# Patient Record
Sex: Female | Born: 1965 | Race: White | Hispanic: No | Marital: Married | Smoking: Former smoker
Health system: Southern US, Community
[De-identification: ages and names within clinical notes are randomized; demographics above are authoritative.]

## PROBLEM LIST (undated history)

## (undated) DIAGNOSIS — E039 Hypothyroidism, unspecified: Secondary | ICD-10-CM

## (undated) DIAGNOSIS — F329 Major depressive disorder, single episode, unspecified: Secondary | ICD-10-CM

## (undated) DIAGNOSIS — E559 Vitamin D deficiency, unspecified: Secondary | ICD-10-CM

## (undated) DIAGNOSIS — E041 Nontoxic single thyroid nodule: Secondary | ICD-10-CM

## (undated) DIAGNOSIS — F32A Depression, unspecified: Secondary | ICD-10-CM

## (undated) DIAGNOSIS — N200 Calculus of kidney: Secondary | ICD-10-CM

## (undated) HISTORY — DX: Calculus of kidney: N20.0

## (undated) HISTORY — DX: Depression, unspecified: F32.A

## (undated) HISTORY — DX: Hypothyroidism, unspecified: E03.9

## (undated) HISTORY — DX: Vitamin D deficiency, unspecified: E55.9

## (undated) HISTORY — PX: INGUINAL HERNIA REPAIR: SUR1180

## (undated) HISTORY — DX: Major depressive disorder, single episode, unspecified: F32.9

## (undated) HISTORY — DX: Nontoxic single thyroid nodule: E04.1

## (undated) HISTORY — PX: BREAST EXCISIONAL BIOPSY: SUR124

---
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
## 1999-09-04 ENCOUNTER — Other Ambulatory Visit: Admission: RE | Admit: 1999-09-04 | Discharge: 1999-09-04 | Payer: Self-pay | Admitting: Obstetrics and Gynecology

## 2000-09-19 ENCOUNTER — Other Ambulatory Visit: Admission: RE | Admit: 2000-09-19 | Discharge: 2000-09-19 | Payer: Self-pay | Admitting: Obstetrics and Gynecology

## 2001-09-19 ENCOUNTER — Other Ambulatory Visit: Admission: RE | Admit: 2001-09-19 | Discharge: 2001-09-19 | Payer: Self-pay | Admitting: Obstetrics and Gynecology

## 2002-08-05 ENCOUNTER — Encounter: Admission: RE | Admit: 2002-08-05 | Discharge: 2002-08-05 | Payer: Self-pay | Admitting: Obstetrics & Gynecology

## 2002-08-05 ENCOUNTER — Encounter: Payer: Self-pay | Admitting: Obstetrics & Gynecology

## 2002-11-03 ENCOUNTER — Other Ambulatory Visit: Admission: RE | Admit: 2002-11-03 | Discharge: 2002-11-03 | Payer: Self-pay | Admitting: Obstetrics and Gynecology

## 2003-08-13 ENCOUNTER — Encounter: Payer: Self-pay | Admitting: Obstetrics & Gynecology

## 2003-08-13 ENCOUNTER — Encounter: Admission: RE | Admit: 2003-08-13 | Discharge: 2003-08-13 | Payer: Self-pay | Admitting: Obstetrics & Gynecology

## 2003-11-09 ENCOUNTER — Other Ambulatory Visit: Admission: RE | Admit: 2003-11-09 | Discharge: 2003-11-09 | Payer: Self-pay | Admitting: Obstetrics & Gynecology

## 2004-02-09 ENCOUNTER — Encounter: Admission: RE | Admit: 2004-02-09 | Discharge: 2004-02-09 | Payer: Self-pay | Admitting: Obstetrics & Gynecology

## 2004-05-24 ENCOUNTER — Ambulatory Visit (HOSPITAL_COMMUNITY): Admission: RE | Admit: 2004-05-24 | Discharge: 2004-05-24 | Payer: Self-pay | Admitting: Chiropractic Medicine

## 2004-10-04 ENCOUNTER — Ambulatory Visit: Payer: Self-pay | Admitting: Internal Medicine

## 2004-12-14 ENCOUNTER — Ambulatory Visit (HOSPITAL_COMMUNITY): Admission: RE | Admit: 2004-12-14 | Discharge: 2004-12-14 | Payer: Self-pay | Admitting: Surgery

## 2004-12-14 ENCOUNTER — Ambulatory Visit (HOSPITAL_BASED_OUTPATIENT_CLINIC_OR_DEPARTMENT_OTHER): Admission: RE | Admit: 2004-12-14 | Discharge: 2004-12-14 | Payer: Self-pay | Admitting: Surgery

## 2004-12-14 ENCOUNTER — Encounter (INDEPENDENT_AMBULATORY_CARE_PROVIDER_SITE_OTHER): Payer: Self-pay | Admitting: Specialist

## 2005-01-19 ENCOUNTER — Ambulatory Visit: Payer: Self-pay | Admitting: Endocrinology

## 2005-01-19 ENCOUNTER — Encounter: Admission: RE | Admit: 2005-01-19 | Discharge: 2005-01-19 | Payer: Self-pay | Admitting: Endocrinology

## 2005-04-18 ENCOUNTER — Ambulatory Visit: Payer: Self-pay | Admitting: Internal Medicine

## 2005-05-23 ENCOUNTER — Ambulatory Visit (HOSPITAL_COMMUNITY): Admission: RE | Admit: 2005-05-23 | Discharge: 2005-05-23 | Payer: Self-pay | Admitting: *Deleted

## 2006-01-16 ENCOUNTER — Ambulatory Visit: Payer: Self-pay | Admitting: Internal Medicine

## 2006-03-13 ENCOUNTER — Encounter: Admission: RE | Admit: 2006-03-13 | Discharge: 2006-03-13 | Payer: Self-pay | Admitting: Obstetrics & Gynecology

## 2006-04-16 ENCOUNTER — Ambulatory Visit: Payer: Self-pay | Admitting: Internal Medicine

## 2006-04-17 ENCOUNTER — Ambulatory Visit: Payer: Self-pay | Admitting: Internal Medicine

## 2006-05-01 ENCOUNTER — Ambulatory Visit: Payer: Self-pay

## 2006-08-28 ENCOUNTER — Ambulatory Visit: Payer: Self-pay | Admitting: Internal Medicine

## 2007-07-15 ENCOUNTER — Ambulatory Visit: Payer: Self-pay | Admitting: Internal Medicine

## 2007-12-22 ENCOUNTER — Encounter: Admission: RE | Admit: 2007-12-22 | Discharge: 2007-12-22 | Payer: Self-pay | Admitting: Obstetrics and Gynecology

## 2009-01-28 ENCOUNTER — Encounter: Admission: RE | Admit: 2009-01-28 | Discharge: 2009-01-28 | Payer: Self-pay | Admitting: Obstetrics & Gynecology

## 2009-09-19 ENCOUNTER — Encounter: Payer: Self-pay | Admitting: Internal Medicine

## 2009-11-12 HISTORY — PX: INTRAUTERINE DEVICE INSERTION: SHX323

## 2010-02-08 ENCOUNTER — Encounter: Admission: RE | Admit: 2010-02-08 | Discharge: 2010-02-08 | Payer: Self-pay | Admitting: Obstetrics and Gynecology

## 2010-08-01 ENCOUNTER — Encounter: Payer: Self-pay | Admitting: Internal Medicine

## 2010-08-22 ENCOUNTER — Ambulatory Visit: Payer: Self-pay | Admitting: Internal Medicine

## 2010-08-22 DIAGNOSIS — E041 Nontoxic single thyroid nodule: Secondary | ICD-10-CM

## 2010-08-22 DIAGNOSIS — R5381 Other malaise: Secondary | ICD-10-CM

## 2010-08-22 DIAGNOSIS — R634 Abnormal weight loss: Secondary | ICD-10-CM

## 2010-08-22 DIAGNOSIS — R5383 Other fatigue: Secondary | ICD-10-CM

## 2010-08-22 DIAGNOSIS — F329 Major depressive disorder, single episode, unspecified: Secondary | ICD-10-CM

## 2010-08-22 DIAGNOSIS — E039 Hypothyroidism, unspecified: Secondary | ICD-10-CM | POA: Insufficient documentation

## 2010-08-23 ENCOUNTER — Encounter: Admission: RE | Admit: 2010-08-23 | Discharge: 2010-08-23 | Payer: Self-pay | Admitting: Internal Medicine

## 2010-08-25 ENCOUNTER — Telehealth: Payer: Self-pay | Admitting: Internal Medicine

## 2010-09-05 ENCOUNTER — Ambulatory Visit: Payer: Self-pay | Admitting: Internal Medicine

## 2010-09-06 ENCOUNTER — Ambulatory Visit: Payer: Self-pay | Admitting: Internal Medicine

## 2010-09-07 LAB — CONVERTED CEMR LAB: Free T4: 1 ng/dL (ref 0.60–1.60)

## 2010-10-19 ENCOUNTER — Encounter: Payer: Self-pay | Admitting: Internal Medicine

## 2010-12-02 ENCOUNTER — Other Ambulatory Visit: Payer: Self-pay | Admitting: Endocrinology

## 2010-12-02 DIAGNOSIS — E049 Nontoxic goiter, unspecified: Secondary | ICD-10-CM

## 2010-12-12 NOTE — Progress Notes (Signed)
  Phone Note Call from Patient   Summary of Call: Patient is requesting to be called back at [phone] and have her paged.  Initial call taken by: Lamar Sprinkles, CMA,  August 25, 2010 10:22 AM  Follow-up for Phone Call        Pt informed  Follow-up by: Lamar Sprinkles, CMA,  August 25, 2010 10:27 AM

## 2010-12-12 NOTE — Letter (Signed)
Summary: Out of Work  [facility]  [address] [place], [place] [address]   Phone: [phone]  Fax: [phone]    September 06, 2010   Employee:  Levin Erp    To Whom It May Concern:   For Medical reasons, please excuse the above named employee from work   Patient was in our office for appointment today 09/06/2010  If you need additional information, please feel free to contact our office.         Sincerely,    Alvy Beal A CMA for Dr. Posey Rea

## 2010-12-12 NOTE — Assessment & Plan Note (Signed)
Summary: 2-3 WK FU  STC   Vital Signs:  Patient profile:   45 year old female Height:      65 inches Weight:      145 pounds BMI:     24.22 Pulse rate:   96 / minute Pulse rhythm:   regular Resp:     16 per minute BP sitting:   100 / 70  (left arm) Cuff size:   regular  Vitals Entered By: Lanier Prude, CMA(AAMA) (September 06, 2010 8:07 AM) CC: 2 wk f/u of Thyroid tests Is Patient Diabetic? No Comments pt has not started Vit D yet   CC:  2 wk f/u of Thyroid tests.  History of Present Illness: F/u hypothyroidism, thyroid nodule, wt loss  Current Medications (verified): 1)  Zoloft 50 Mg Tabs (Sertraline Hcl) .Marland Kitchen.. 1 Once Daily 2)  Buspirone Hcl 5 Mg Tabs (Buspirone Hcl) .Marland Kitchen.. 1 Two Times A Day 3)  Levothroid 88 Mcg Tabs (Levothyroxine Sodium) .Marland Kitchen.. 1 Once Daily 4)  Mirena 20 Mcg/24hr Iud (Levonorgestrel) .... As Directed 5)  Alprazolam 0.25 Mg Tabs (Alprazolam) .Marland Kitchen.. 1 By Mouth Two Times A Day As Needed Anxiety 6)  Vitamin D 1000 Unit Tabs (Cholecalciferol) .Marland Kitchen.. 1 By Mouth Qd  Allergies (verified): No Known Drug Allergies  Past History:  Past Medical History: Depression Hypothyroidism Thyroid nodule GYN Debbora Dus NP Low nl Vit B12 2011  Family History: Reviewed history from 08/22/2010 and no changes required. M died of liver or a billiary Ca F HTN S,B well  Social History: Reviewed history from 08/22/2010 and no changes required. Occupation: [employer] [place] Current Smoker 1-2 cig a day Alcohol use-yes 8 wine coolers/wk Regular exercise-yes Married w/2 kids d 9 and s 16  Review of Systems  The patient denies weight loss.    Physical Exam  General:  Well-developed,well-nourished,in no acute distress; alert,appropriate and cooperative throughout examination Nose:  External nasal examination shows no deformity or inflammation. Nasal mucosa are pink and moist without lesions or exudates. Mouth:  Oral mucosa and oropharynx without lesions or exudates.   Teeth in good repair. Neck:  R thyroid nodule Lungs:  Normal respiratory effort, chest expands symmetrically. Lungs are clear to auscultation, no crackles or wheezes. Heart:  Normal rate and regular rhythm. S1 and S2 normal without gallop, murmur, click, rub or other extra sounds. Abdomen:  Bowel sounds positive,abdomen soft and non-tender without masses, organomegaly or hernias noted. Msk:  No deformity or scoliosis noted of thoracic or lumbar spine.   Neurologic:  No cranial nerve deficits noted. Station and gait are normal. Plantar reflexes are down-going bilaterally. DTRs are symmetrical throughout. Sensory, motor and coordinative functions appear intact. Skin:  Intact without suspicious lesions or rashes Psych:  Cognition and judgment appear intact. Alert and cooperative with normal attention span and concentration. No apparent delusions, illusions, hallucinations   Impression & Recommendations:  Problem # 1:  HYPOTHYROIDISM (ICD-244.9) Assessment Improved  Her updated medication list for this problem includes:    Levothroid 88 Mcg Tabs (Levothyroxine sodium) .Marland Kitchen... 1 once daily  Orders: Endocrinology Referral (Endocrine)  Problem # 2:  DEPRESSION (ICD-311) Assessment: Unchanged  Her updated medication list for this problem includes:    Zoloft 50 Mg Tabs (Sertraline hcl) .Marland Kitchen... 1 once daily    Buspirone Hcl 5 Mg Tabs (Buspirone hcl) .Marland Kitchen... 1 two times a day    Alprazolam 0.25 Mg Tabs (Alprazolam) .Marland Kitchen... 1 by mouth two times a day as needed anxiety  Problem # 3:  WEIGHT LOSS (ICD-783.21) resolved Assessment: Improved  Problem # 4:  THYROID NODULE (ICD-241.0) Assessment: New  [place] reviewed  Orders: Endocrinology Referral (Endocrine) Dr Talmage Nap  Complete Medication List: 1)  Zoloft 50 Mg Tabs (Sertraline hcl) .Marland Kitchen.. 1 once daily 2)  Buspirone Hcl 5 Mg Tabs (Buspirone hcl) .Marland Kitchen.. 1 two times a day 3)  Levothroid 88 Mcg Tabs (Levothyroxine sodium) .Marland Kitchen.. 1 once daily 4)  Mirena 20  Mcg/24hr Iud (Levonorgestrel) .... As directed 5)  Alprazolam 0.25 Mg Tabs (Alprazolam) .Marland Kitchen.. 1 by mouth two times a day as needed anxiety 6)  Vitamin D 1000 Unit Tabs (Cholecalciferol) .Marland Kitchen.. 1 by mouth qd 7)  Vitamin B-12 500 Mcg Tabs (Cyanocobalamin) .Marland Kitchen.. 1 by mouth once daily for vitamin b12 deficiency  Patient Instructions: 1)  Please schedule a follow-up appointment in 6 months TSH B12 780.79. Prescriptions: VITAMIN B-12 500 MCG TABS (CYANOCOBALAMIN) 1 by mouth once daily for Vitamin B12 deficiency  #30 x 12   Entered and Authorized by:   Tresa Garter MD   Signed by:   Tresa Garter MD on 09/06/2010   Method used:   Electronically to        [facility]  [phone]* (retail)       [address]       [place], [place]  [address]       Ph: [phone] or [phone]       Fax: [phone]   RxID:   [id number] LEVOTHROID 88 MCG TABS (LEVOTHYROXINE SODIUM) 1 once daily  #90 x 3   Entered and Authorized by:   Tresa Garter MD   Signed by:   Tresa Garter MD on 09/06/2010   Method used:   Electronically to        [facility]  [phone]* (retail)       [address]       [place], [place]  [address]       Ph: [phone] or [phone]       Fax: [phone]   RxID:   [id number]    Orders Added: 1)  Endocrinology Referral [Endocrine] 2)  Est. Patient Level IV [53299]

## 2010-12-12 NOTE — Assessment & Plan Note (Signed)
Summary: last ov 2008/[employer]/cd   Vital Signs:  Patient profile:   45 year old female Height:      65 inches Weight:      145 pounds BMI:     24.22 Temp:     98.3 degrees F oral Pulse rate:   88 / minute Pulse rhythm:   regular Resp:     16 per minute BP sitting:   122 / 88  (left arm) Cuff size:   regular  Vitals Entered By: Lanier Prude, Beverly Gust) (August 22, 2010 9:34 AM) CC: re est PCP c/o unexplained weight loss Is Patient Diabetic? No   CC:  re est PCP c/o unexplained weight loss.  History of Present Illness: New pt - not seen in 3 years C/o loss 25 lbs in 4 months  C/o stress w/new job Last TSH 0.181 - her Synthroid was reduced from 100 to 88 micrograms this week C/o occasional L abd discomfort at times. Tired at times  Preventive Screening-Counseling & Management  Alcohol-Tobacco     Alcohol drinks/day: <1     Smoking Status: current     Packs/Day: <0.25  Caffeine-Diet-Exercise     Does Patient Exercise: yes  Current Medications (verified): 1)  Zoloft 50 Mg Tabs (Sertraline Hcl) .Marland Kitchen.. 1 Once Daily 2)  Buspirone Hcl 5 Mg Tabs (Buspirone Hcl) .Marland Kitchen.. 1 Two Times A Day 3)  Levothroid 88 Mcg Tabs (Levothyroxine Sodium) .Marland Kitchen.. 1 Once Daily 4)  Mirena 20 Mcg/24hr Iud (Levonorgestrel) .... As Directed  Allergies (verified): No Known Drug Allergies  Past History:  Past Medical History: Depression Hypothyroidism GYN Debbora Dus NP  Family History: M died of liver or a billiary Ca F HTN S,B well  Social History: Occupation: Actor [employer] Current Smoker 1-2 cig a day Alcohol use-yes 8 wine coolers/wk Regular exercise-yes Married w/2 kids d 30 and s 14 Smoking Status:  current Packs/Day:  <0.25 Does Patient Exercise:  yes  Review of Systems       The patient complains of weight loss.  The patient denies anorexia, fever, weight gain, vision loss, decreased hearing, hoarseness, chest pain, syncope, dyspnea on exertion, peripheral edema, prolonged  cough, headaches, hemoptysis, abdominal pain, melena, hematochezia, severe indigestion/heartburn, hematuria, incontinence, genital sores, muscle weakness, suspicious skin lesions, transient blindness, difficulty walking, depression, unusual weight change, abnormal bleeding, enlarged lymph nodes, angioedema, and breast masses.    Physical Exam  General:  Well-developed,well-nourished,in no acute distress; alert,appropriate and cooperative throughout examination Head:  Normocephalic and atraumatic without obvious abnormalities. No apparent alopecia or balding. Eyes:  No corneal or conjunctival inflammation noted. EOMI. Perrla. Ears:  External ear exam shows no significant lesions or deformities.  Otoscopic examination reveals clear canals, tympanic membranes are intact bilaterally without bulging, retraction, inflammation or discharge. Hearing is grossly normal bilaterally. Nose:  External nasal examination shows no deformity or inflammation. Nasal mucosa are pink and moist without lesions or exudates. Mouth:  Oral mucosa and oropharynx without lesions or exudates.  Teeth in good repair. Neck:  R thyroid nodule Chest Wall:  No deformities, masses, or tenderness noted. Lungs:  Normal respiratory effort, chest expands symmetrically. Lungs are clear to auscultation, no crackles or wheezes. Heart:  Normal rate and regular rhythm. S1 and S2 normal without gallop, murmur, click, rub or other extra sounds. Abdomen:  Bowel sounds positive,abdomen soft and non-tender without masses, organomegaly or hernias noted. Msk:  No deformity or scoliosis noted of thoracic or lumbar spine.   Pulses:  R and L  carotid,radial,femoral,dorsalis pedis and posterior tibial pulses are full and equal bilaterally Neurologic:  No cranial nerve deficits noted. Station and gait are normal. Plantar reflexes are down-going bilaterally. DTRs are symmetrical throughout. Sensory, motor and coordinative functions appear intact. Skin:   Intact without suspicious lesions or rashes Cervical Nodes:  No lymphadenopathy noted Inguinal Nodes:  No significant adenopathy Psych:  Cognition and judgment appear intact. Alert and cooperative with normal attention span and concentration. No apparent delusions, illusions, hallucinations   Impression & Recommendations:  Problem # 1:  WEIGHT LOSS (ICD-783.21) poss due to hyperthyroid state- Rx dose was recentely corrected Assessment New  Orders: T-2 View CXR, Same Day (71020.5TC)  Problem # 2:  HYPOTHYROIDISM (ICD-244.9) Assessment: Comment Only See "Patient Instructions".  Her updated medication list for this problem includes:    Levothroid 88 Mcg Tabs (Levothyroxine sodium) .Marland Kitchen... 1 once daily  Problem # 3:  DEPRESSION (ICD-311) Assessment: Unchanged  Her updated medication list for this problem includes:    Zoloft 50 Mg Tabs (Sertraline hcl) .Marland Kitchen... 1 once daily    Buspirone Hcl 5 Mg Tabs (Buspirone hcl) .Marland Kitchen... 1 two times a day    Alprazolam 0.25 Mg Tabs (Alprazolam) .Marland Kitchen... 1 by mouth two times a day as needed anxiety  Problem # 4:  FATIGUE (ICD-780.79) Assessment: New See "Patient Instructions". Check B12  Complete Medication List: 1)  Zoloft 50 Mg Tabs (Sertraline hcl) .Marland Kitchen.. 1 once daily 2)  Buspirone Hcl 5 Mg Tabs (Buspirone hcl) .Marland Kitchen.. 1 two times a day 3)  Levothroid 88 Mcg Tabs (Levothyroxine sodium) .Marland Kitchen.. 1 once daily 4)  Mirena 20 Mcg/24hr Iud (Levonorgestrel) .... As directed 5)  Alprazolam 0.25 Mg Tabs (Alprazolam) .Marland Kitchen.. 1 by mouth two times a day as needed anxiety 6)  Vitamin D 1000 Unit Tabs (Cholecalciferol) .Marland Kitchen.. 1 by mouth qd  Other Orders: Radiology Referral (Radiology)  Patient Instructions: 1)  Please schedule a follow-up appointment in 2-3 weeks with TSH, FT3, FT4, Vit B12 780.70 244.80. Prescriptions: ALPRAZOLAM 0.25 MG TABS (ALPRAZOLAM) 1 by mouth two times a day as needed anxiety  #60 x 2   Entered and Authorized by:   Tresa Garter MD   Signed  by:   Tresa Garter MD on 08/22/2010   Method used:   Print then Give to Patient   RxID:   [id number]    Preventive Care Screening  Mammogram:    Date:  03/20/2010    Results:  normal   Last Tetanus Booster:    Date:  11/12/2004    Results:  Td     Immunization History:  Influenza Immunization History:    Influenza:  historical (08/18/2010)

## 2010-12-20 NOTE — Consult Note (Signed)
Summary: Endocrine Consult/[facility]  Endocrine Consult/[facility]   Imported By: Sherian Rein 12/14/2010 10:48:28  _____________________________________________________________________  External Attachment:    Type:   Image     Comment:   External Document

## 2011-02-08 ENCOUNTER — Other Ambulatory Visit: Payer: Self-pay | Admitting: Obstetrics & Gynecology

## 2011-02-08 DIAGNOSIS — Z1231 Encounter for screening mammogram for malignant neoplasm of breast: Secondary | ICD-10-CM

## 2011-02-19 ENCOUNTER — Ambulatory Visit
Admission: RE | Admit: 2011-02-19 | Discharge: 2011-02-19 | Disposition: A | Discharge status: Home / Self Care | Payer: Managed Care, Other (non HMO) | Source: Ambulatory Visit | Attending: Obstetrics & Gynecology | Admitting: Obstetrics & Gynecology

## 2011-02-19 DIAGNOSIS — Z1231 Encounter for screening mammogram for malignant neoplasm of breast: Secondary | ICD-10-CM

## 2011-02-28 ENCOUNTER — Other Ambulatory Visit: Payer: Self-pay

## 2011-02-28 ENCOUNTER — Other Ambulatory Visit: Payer: Self-pay | Admitting: Internal Medicine

## 2011-02-28 DIAGNOSIS — E538 Deficiency of other specified B group vitamins: Secondary | ICD-10-CM

## 2011-02-28 DIAGNOSIS — E038 Other specified hypothyroidism: Secondary | ICD-10-CM

## 2011-03-07 ENCOUNTER — Ambulatory Visit: Payer: Self-pay | Admitting: Internal Medicine

## 2011-03-07 DIAGNOSIS — Z0289 Encounter for other administrative examinations: Secondary | ICD-10-CM

## 2011-03-15 ENCOUNTER — Encounter: Payer: Self-pay | Admitting: Internal Medicine

## 2011-03-15 ENCOUNTER — Ambulatory Visit (INDEPENDENT_AMBULATORY_CARE_PROVIDER_SITE_OTHER): Payer: Managed Care, Other (non HMO) | Admitting: Internal Medicine

## 2011-03-15 DIAGNOSIS — R519 Headache, unspecified: Secondary | ICD-10-CM | POA: Insufficient documentation

## 2011-03-15 DIAGNOSIS — R51 Headache: Secondary | ICD-10-CM

## 2011-03-15 DIAGNOSIS — E039 Hypothyroidism, unspecified: Secondary | ICD-10-CM

## 2011-03-15 DIAGNOSIS — R202 Paresthesia of skin: Secondary | ICD-10-CM | POA: Insufficient documentation

## 2011-03-15 DIAGNOSIS — R209 Unspecified disturbances of skin sensation: Secondary | ICD-10-CM

## 2011-03-15 DIAGNOSIS — R4789 Other speech disturbances: Secondary | ICD-10-CM

## 2011-03-15 DIAGNOSIS — R479 Unspecified speech disturbances: Secondary | ICD-10-CM

## 2011-03-15 DIAGNOSIS — H539 Unspecified visual disturbance: Secondary | ICD-10-CM | POA: Insufficient documentation

## 2011-03-15 MED ORDER — HYDROCODONE-ACETAMINOPHEN 7.5-325 MG PO TABS: 1.0000 | ORAL_TABLET | Freq: Four times a day (QID) | ORAL | Status: AC | PRN

## 2011-03-15 MED ORDER — VITAMIN B-12 1000 MCG PO TABS: 1000.0000 ug | ORAL_TABLET | Freq: Every day | ORAL | Status: AC

## 2011-03-15 MED ORDER — CYCLOBENZAPRINE HCL 5 MG PO TABS: 5.0000 mg | ORAL_TABLET | Freq: Two times a day (BID) | ORAL | Status: AC | PRN

## 2011-03-15 MED ORDER — SERTRALINE HCL 50 MG PO TABS: 50.0000 mg | ORAL_TABLET | Freq: Every day | ORAL | Status: DC

## 2011-03-15 NOTE — Assessment & Plan Note (Signed)
Likely a severe migraine triggered by neck strain/pain. See meds. Heat.

## 2011-03-15 NOTE — Assessment & Plan Note (Signed)
R/o TIA, carotid disection etc Start ASA MRI/MRA brain, neck

## 2011-03-15 NOTE — Progress Notes (Signed)
Subjective:    Patient ID: Janice Kennedy, female    DOB: 03/22/1966, 45 y.o.   MRN: [id number]  HPI  C/o HA x 1 wk Las wk she moved a glass table top last Sat - very heavy. C/o severe neck stiffness and. At work, yesterday, at 10 am she had a problem with vision - lost vision out of L eye x 5-10 min, R and L hand numbness x 3 min and a speech problem - all within 1.5 hr. BP was OK. No HA now. She slept x 14 hrs - better today  Review of Systems  Constitutional: Positive for activity change. Negative for fever, chills, diaphoresis, appetite change, fatigue and unexpected weight change.  HENT: Negative for hearing loss, ear pain, nosebleeds, congestion, sore throat, facial swelling, rhinorrhea, sneezing, mouth sores, trouble swallowing, neck pain, neck stiffness, postnasal drip, sinus pressure and tinnitus.   Eyes: Positive for visual disturbance. Negative for pain, discharge, redness and itching.  Respiratory: Negative for cough, chest tightness, shortness of breath, wheezing and stridor.   Cardiovascular: Negative for chest pain, palpitations and leg swelling.  Gastrointestinal: Negative for nausea, vomiting, diarrhea, constipation, blood in stool, abdominal distention, anal bleeding and rectal pain.  Genitourinary: Negative for dysuria, urgency, frequency, hematuria, flank pain, vaginal bleeding, vaginal discharge, difficulty urinating, genital sores and pelvic pain.  Musculoskeletal: Positive for back pain (neck pain). Negative for joint swelling, arthralgias and gait problem.  Skin: Negative.  Negative for rash.  Neurological: Positive for numbness and headaches. Negative for dizziness, tremors, seizures, syncope, speech difficulty and weakness.  Hematological: Negative for adenopathy. Does not bruise/bleed easily.  Psychiatric/Behavioral: Negative for suicidal ideas, behavioral problems, sleep disturbance, dysphoric mood and decreased concentration. The patient is not nervous/anxious.     Wt Readings from Last 3 Encounters:  03/15/11 158 lb (71.668 kg)  09/06/10 145 lb (65.772 kg)  08/22/10 145 lb (65.772 kg)   BP Readings from Last 3 Encounters:  03/15/11 100/70  09/06/10 100/70  08/22/10 122/88        Objective:   Physical Exam  Constitutional: She appears well-developed and well-nourished. No distress.  HENT:  Head: Normocephalic.  Right Ear: External ear normal.  Left Ear: External ear normal.  Nose: Nose normal.  Mouth/Throat: Oropharynx is clear and moist.  Eyes: Conjunctivae are normal. Pupils are equal, round, and reactive to light. Right eye exhibits no discharge. Left eye exhibits no discharge.  Neck: Normal range of motion. Neck supple. No JVD present. No tracheal deviation present. No thyromegaly present.       No bruit  Cardiovascular: Normal rate, regular rhythm and normal heart sounds.  Exam reveals no gallop and no friction rub.   No murmur heard. Pulmonary/Chest: No stridor. No respiratory distress. She has no wheezes.  Abdominal: Soft. Bowel sounds are normal. She exhibits no distension and no mass. There is no tenderness. There is no rebound and no guarding.  Musculoskeletal: She exhibits no edema and no tenderness.  Lymphadenopathy:    She has no cervical adenopathy.  Neurological: She displays normal reflexes. No cranial nerve deficit. She exhibits normal muscle tone. Coordination normal.       No pron drift No tremor Romberg is neg Heel to toe nl Face symmetric  Skin: No rash noted. No erythema.  Psychiatric: She has a normal mood and affect. Her behavior is normal. Judgment and thought content normal.          Assessment & Plan:  Vision disturbance R/o TIA, carotid disection  etc Start ASA MRI/MRA brain, neck  Headache Likely a severe migraine triggered by neck strain/pain. See meds. Heat.  Paresthesia Labs incl B12 vit are OK from 5/2  HYPOTHYROIDISM On Rx  TSH is nl

## 2011-03-15 NOTE — Assessment & Plan Note (Signed)
Labs incl B12 vit are OK from 5/2

## 2011-03-15 NOTE — Patient Instructions (Addendum)
To ER if problems! Take aspirin 1 a day

## 2011-03-15 NOTE — Assessment & Plan Note (Signed)
On Rx  TSH is nl

## 2011-03-20 ENCOUNTER — Ambulatory Visit
Admission: RE | Admit: 2011-03-20 | Discharge: 2011-03-20 | Disposition: A | Discharge status: Home / Self Care | Payer: Managed Care, Other (non HMO) | Source: Ambulatory Visit | Attending: Internal Medicine | Admitting: Internal Medicine

## 2011-03-20 DIAGNOSIS — R479 Unspecified speech disturbances: Secondary | ICD-10-CM

## 2011-03-20 DIAGNOSIS — R51 Headache: Secondary | ICD-10-CM

## 2011-03-20 DIAGNOSIS — H539 Unspecified visual disturbance: Secondary | ICD-10-CM

## 2011-03-20 DIAGNOSIS — R202 Paresthesia of skin: Secondary | ICD-10-CM

## 2011-03-20 MED ORDER — GADOBENATE DIMEGLUMINE 529 MG/ML IV SOLN
14.0000 mL | Freq: Once | INTRAVENOUS | Status: AC | PRN
  Administered 2011-03-20: 14 mL via INTRAVENOUS

## 2011-03-21 ENCOUNTER — Other Ambulatory Visit: Payer: Self-pay | Admitting: Internal Medicine

## 2011-03-21 ENCOUNTER — Telehealth: Payer: Self-pay | Admitting: *Deleted

## 2011-03-21 NOTE — Telephone Encounter (Signed)
OK to fill this prescription with additional refills x0 Thank you!  

## 2011-03-21 NOTE — Telephone Encounter (Signed)
rec rf req for Alprazolam 0.25mg  1 po bid prn. # 60 Last filled 02-02-11.

## 2011-03-21 NOTE — Telephone Encounter (Signed)
Janice Kennedy, please, inform patient that her brain nad neck MRI/MRAs are normal except for mild thyromegaly and mild atherosclerosis  Please, mail the labs to the patient.    Thx .

## 2011-03-22 ENCOUNTER — Telehealth: Payer: Self-pay | Admitting: *Deleted

## 2011-03-22 MED ORDER — ALPRAZOLAM 0.25 MG PO TABS: 0.2500 mg | ORAL_TABLET | Freq: Two times a day (BID) | ORAL | Status: DC | PRN

## 2011-03-22 NOTE — Telephone Encounter (Signed)
Left mess for patient to call back.  

## 2011-03-22 NOTE — Telephone Encounter (Signed)
Patient requesting results of MRI.

## 2011-03-22 NOTE — Telephone Encounter (Signed)
rf phoned to [employer]

## 2011-03-22 NOTE — Telephone Encounter (Signed)
Janice Kennedy - Can you help with this PLEASE?

## 2011-03-22 NOTE — Telephone Encounter (Signed)
Pls see MRI tel note - OK Thx

## 2011-03-22 NOTE — Telephone Encounter (Signed)
Pt informed/copies faxed to her at [phone]

## 2011-03-23 NOTE — Telephone Encounter (Signed)
Pt informed and copies faxed to pt on 03-22-11

## 2011-03-26 ENCOUNTER — Encounter: Payer: Self-pay | Admitting: Internal Medicine

## 2011-03-30 NOTE — Op Note (Signed)
NAMEBENAY, Janice Kennedy           ACCOUNT NO.:  [id number]   MEDICAL RECORD NO.:  [id number]          PATIENT TYPE:  AMB   LOCATION:  [facility]                         FACILITY:  [facility]   PHYSICIAN:  Currie Paris, M.D.DATE OF BIRTH:  [birth year]   DATE OF PROCEDURE:  12/14/2004  DATE OF DISCHARGE:                                 OPERATIVE REPORT   OFFICE MEDICAL RECORD NUMBER:  [id number].   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   OPERATION:  Ductal excision, right breast.   SURGEON:  Currie Paris, M.D.   ASSISTANT:  Kizzie Furnish, P.A. student.   ANESTHESIA:  General.   CLINICAL HISTORY:  This patient is a 45 year old with a spontaneous nipple  discharge from the 9 o'clock position on the right.  What appeared to be a  small intraductal papilloma was noted on ultrasound.  She elected to proceed  to excisional biopsy.   DESCRIPTION OF PROCEDURE:  The patient was seen in the holding area and had  no further questions.  The right side was identified and marked as the  operative side.   The patient was taken to the operating room and after satisfactory general  anesthesia, the right breast was prepped and draped.  A time out was done.  The nipple discharge on the right side was readily expressible.  I  cannulated the duct and injected a little bit of methylene blue.  I then  placed a tear duct probe in the duct.  I made an elliptical incision on the  lateral aspect of the areolar margin.  I elevated the skin over to where I  could identify the duct and divided the duct as it attached to the  undersurface of the nipple.  Then I grasped the breast tissue around where  the duct was tracking and using some retractors to elevate the skin and the  cautery, I excised an area of tissue around the duct.  As I got about  1.5 cm  deep, we saw a large collection of blue in the tissues and I suspected this  was where the intraductal papilloma was.  There was a mass effect palpable  in this area.  There is nothing to suggest cancer.   The rest of the breast was checked for hemostasis and once everything was  dry, I did not see any evidence of any other blue so I thought I had gotten  all of this ductal system out.  I closed the breast with some 3-0 Vicryl in  the  deeper layers of the breast and then using 4-0 Monocryl subcuticular and  Dermabond on the skin.  A 0.25% plain Marcaine was injected prior to closing  to help with postop analgesia.   The patient tolerated the procedure well.  There were no operative  complications.  All counts were correct.      CJS/MEDQ  D:  12/14/2004  T:  12/14/2004  Job:  [id number]   cc:   Georgina Quint. Plotnikov, M.D. [facility]  Pershing Cox, M.D.  [address]  [place]  [place] [address]  Fax: [phone]

## 2011-04-06 ENCOUNTER — Ambulatory Visit: Payer: Managed Care, Other (non HMO) | Admitting: Internal Medicine

## 2011-04-06 DIAGNOSIS — Z0289 Encounter for other administrative examinations: Secondary | ICD-10-CM

## 2012-05-28 ENCOUNTER — Other Ambulatory Visit: Payer: Self-pay | Admitting: Obstetrics and Gynecology

## 2012-05-28 MED ORDER — ALPRAZOLAM 0.25 MG PO TABS: 0.2500 mg | ORAL_TABLET | Freq: Two times a day (BID) | ORAL | Status: DC | PRN

## 2012-06-02 ENCOUNTER — Telehealth: Payer: Self-pay

## 2012-06-02 NOTE — Telephone Encounter (Signed)
DONE

## 2012-07-09 ENCOUNTER — Ambulatory Visit (INDEPENDENT_AMBULATORY_CARE_PROVIDER_SITE_OTHER): Payer: Managed Care, Other (non HMO)

## 2012-07-09 ENCOUNTER — Encounter: Payer: Self-pay | Admitting: Obstetrics and Gynecology

## 2012-07-09 ENCOUNTER — Ambulatory Visit (INDEPENDENT_AMBULATORY_CARE_PROVIDER_SITE_OTHER): Payer: Managed Care, Other (non HMO) | Admitting: Obstetrics and Gynecology

## 2012-07-09 VITALS — BP 118/80 | HR 76 | Temp 98.4°F

## 2012-07-09 DIAGNOSIS — N949 Unspecified condition associated with female genital organs and menstrual cycle: Secondary | ICD-10-CM

## 2012-07-09 DIAGNOSIS — R102 Pelvic and perineal pain: Secondary | ICD-10-CM

## 2012-07-09 DIAGNOSIS — Z87442 Personal history of urinary calculi: Secondary | ICD-10-CM

## 2012-07-09 LAB — POCT URINALYSIS DIPSTICK
Spec Grav, UA: <=1.005
pH: 8

## 2012-07-09 MED ORDER — KETOROLAC TROMETHAMINE 30 MG/ML IM SOLN: 30.0000 mg | Freq: Once | INTRAMUSCULAR | Status: DC

## 2012-07-09 MED ORDER — KETOROLAC TROMETHAMINE 30 MG/ML IJ SOLN
30.0000 mg | Freq: Once | INTRAMUSCULAR | Status: AC
  Administered 2012-07-09: 30 mg via INTRAMUSCULAR

## 2012-07-09 NOTE — Progress Notes (Signed)
46 YO with Mirena IUD  and a history of hydrosalpinx complains of sudden onset of pelvic pain while sitting after lunch.  Pain was sharp and excruciating, being made worse with movement.  Pain became stabbing in nature with transient nausea but now pain is not as severe.  Denies urinary tract or bowel symptoms, dyspareunia or fever.  Has a history of renal stones  O: Abdomen: soft, diffusely tender with voluntary guarding tenderness right greater than left-no rebound      U/A-negative  Toradol 30 mg IM stat  A: Sudden Onset Pelvic Pain-?ruptured ovarian cyst vs renal stone  P: Pelvic U/S - rule out ovarian cyst  Azad Calame, PA-C  ADDENDUM: U/S- uterus-7.53 x 4.79 x 3.86 cm with normal appearing ovaries and IUD in proper position per 3D rendering  Advised Ibuprofen with food as directed  if symptoms are the same in morning will do CBC & CMET and refer to PCP  Brittin Janik, PA-C

## 2012-07-28 ENCOUNTER — Other Ambulatory Visit: Payer: Self-pay | Admitting: Obstetrics and Gynecology

## 2012-07-28 MED ORDER — LEVOTHYROXINE SODIUM 100 MCG PO TABS: 100.0000 ug | ORAL_TABLET | Freq: Every day | ORAL | Status: DC

## 2012-07-28 MED ORDER — ALPRAZOLAM 0.25 MG PO TABS: 0.2500 mg | ORAL_TABLET | Freq: Two times a day (BID) | ORAL | Status: DC | PRN

## 2012-07-28 MED ORDER — LEVOTHYROXINE SODIUM 88 MCG PO TABS: 88.0000 ug | ORAL_TABLET | Freq: Every day | ORAL | Status: DC

## 2012-08-21 ENCOUNTER — Ambulatory Visit: Payer: Managed Care, Other (non HMO) | Admitting: Obstetrics and Gynecology

## 2012-09-09 ENCOUNTER — Other Ambulatory Visit: Payer: Self-pay | Admitting: Obstetrics and Gynecology

## 2012-09-09 DIAGNOSIS — Z1231 Encounter for screening mammogram for malignant neoplasm of breast: Secondary | ICD-10-CM

## 2012-09-17 ENCOUNTER — Other Ambulatory Visit: Payer: Self-pay | Admitting: Obstetrics and Gynecology

## 2012-09-17 MED ORDER — LEVOTHYROXINE SODIUM 100 MCG PO TABS: 100.0000 ug | ORAL_TABLET | Freq: Every day | ORAL | Status: AC

## 2012-09-29 ENCOUNTER — Ambulatory Visit (INDEPENDENT_AMBULATORY_CARE_PROVIDER_SITE_OTHER): Payer: Managed Care, Other (non HMO) | Admitting: Obstetrics and Gynecology

## 2012-09-29 ENCOUNTER — Encounter: Payer: Self-pay | Admitting: Obstetrics and Gynecology

## 2012-09-29 VITALS — BP 120/72 | HR 74 | Ht 67.0 in | Wt 160.0 lb

## 2012-09-29 DIAGNOSIS — F419 Anxiety disorder, unspecified: Secondary | ICD-10-CM

## 2012-09-29 DIAGNOSIS — Z01419 Encounter for gynecological examination (general) (routine) without abnormal findings: Secondary | ICD-10-CM

## 2012-09-29 DIAGNOSIS — E079 Disorder of thyroid, unspecified: Secondary | ICD-10-CM

## 2012-09-29 DIAGNOSIS — Z8639 Personal history of other endocrine, nutritional and metabolic disease: Secondary | ICD-10-CM

## 2012-09-29 DIAGNOSIS — Z130 Encounter for screening for diseases of the blood and blood-forming organs and certain disorders involving the immune mechanism: Secondary | ICD-10-CM

## 2012-09-29 DIAGNOSIS — Z1321 Encounter for screening for nutritional disorder: Secondary | ICD-10-CM

## 2012-09-29 DIAGNOSIS — F411 Generalized anxiety disorder: Secondary | ICD-10-CM

## 2012-09-29 DIAGNOSIS — Z124 Encounter for screening for malignant neoplasm of cervix: Secondary | ICD-10-CM

## 2012-09-29 NOTE — Progress Notes (Signed)
Patient ID: Janice Kennedy, female   DOB: [birth year], 46 y.o.   MRN:  Last Pap: 2012  WNL: Yes Regular Periods:no Contraception: Mirena  Monthly Breast exam:no Tetanus<60yrs:yes Nl.Bladder Function:yes Daily BMs:yes Healthy Diet:yes Calcium:no Mammogram:yes Date of Mammogram: 2012 wnlSched for 10/2012 Exercise:yes Have often Exercise: 4-5 x q week Seatbelt: yes Abuse at home: no Stressful work:no Sigmoid-colonoscopy: 2011 or 12 wnl Bone Density: Yes 2007 approx/ [facility] PCP: Plotnikov Change in PMH: None Change in ZOX:WRUE

## 2012-09-29 NOTE — Progress Notes (Signed)
Subjective:    Janice Kennedy is a 46 y.o. female, No obstetric history on file., who presents for an annual exam. The patient reports she's never followed up on thyroid ultrasound due to cost and lack of urgency relayed by Dr. Talmage Nap.  Last thyroid panel one year ago-will repeat today.  Patient also requests for vitamin D screen.  Menstrual cycle:   LMP: No LMP recorded. Patient is not currently having periods (Reason: IUD).             Review of Systems Pertinent items are noted in HPI. Denies pelvic pain, urinary tract symptoms, vaginitis symptoms, irregular bleeding, menopausal symptoms, change in bowel habits or rectal bleeding   Objective:    BP 120/72  Pulse 74  Ht 5\' 7"  (1.702 m)  Wt 160 lb (72.576 kg)  BMI 25.06 kg/m2   Wt Readings from Last 1 Encounters:  09/29/12 160 lb (72.576 kg)   Body mass index is 25.06 kg/(m^2). General Appearance: Alert, no acute distress HEENT: Grossly normal Neck / Thyroid: Supple, right sided mild  thyromegaly or cervical adenopathy Lungs: Clear to auscultation bilaterally Back: No CVA tenderness Breast Exam: No masses or nodes.No dimpling, nipple retraction or discharge. Cardiovascular: Regular rate and rhythm.  Gastrointestinal: Soft, non-tender, no masses or organomegaly Pelvic Exam: EGBUS-wnl, vagina-normal rugae, cervix- without lesions or tenderness, string visible uterus appears normal size shape and consistency, adnexae-no masses or tenderness Rectovaginal: no masses and normal sphincter tone Lymphatic Exam: Non-palpable nodes in neck, clavicular,  axillary, or inguinal regions  Skin: no rashes or abnormalities Extremities: no clubbing cyanosis or edema  Neurologic: grossly normal Psychiatric: Alert and oriented   Assessment:   Routine GYN Exam Thyroid Disease Thyromegaly   Plan:    PAP sent  Thyroid Panel and Vitamin D 25-H-pending  RTO 1 year or prn  Rovena Hearld,ELMIRAPA-C

## 2012-09-30 LAB — PAP IG W/ RFLX HPV ASCU

## 2012-09-30 LAB — THYROID PANEL WITH TSH
T3 Uptake: 44.2 % — ABNORMAL HIGH (ref 22.5–37.0)
T4, Total: 11.3 ug/dL (ref 5.0–12.5)

## 2012-09-30 LAB — VITAMIN D 25 HYDROXY (VIT D DEFICIENCY, FRACTURES): Vit D, 25-Hydroxy: 33 ng/mL (ref 30–89)

## 2012-10-01 ENCOUNTER — Other Ambulatory Visit: Payer: Self-pay | Admitting: Endocrinology

## 2012-10-01 DIAGNOSIS — E041 Nontoxic single thyroid nodule: Secondary | ICD-10-CM

## 2012-10-16 ENCOUNTER — Ambulatory Visit
Admission: RE | Admit: 2012-10-16 | Discharge: 2012-10-16 | Disposition: A | Discharge status: Home / Self Care | Payer: Managed Care, Other (non HMO) | Source: Ambulatory Visit | Attending: Obstetrics and Gynecology | Admitting: Obstetrics and Gynecology

## 2012-10-16 DIAGNOSIS — Z1231 Encounter for screening mammogram for malignant neoplasm of breast: Secondary | ICD-10-CM

## 2012-10-24 ENCOUNTER — Ambulatory Visit
Admission: RE | Admit: 2012-10-24 | Discharge: 2012-10-24 | Disposition: A | Discharge status: Home / Self Care | Payer: Managed Care, Other (non HMO) | Source: Ambulatory Visit | Attending: Endocrinology | Admitting: Endocrinology

## 2012-10-24 DIAGNOSIS — E041 Nontoxic single thyroid nodule: Secondary | ICD-10-CM

## 2012-11-04 ENCOUNTER — Telehealth: Payer: Self-pay

## 2012-11-04 ENCOUNTER — Other Ambulatory Visit: Payer: Self-pay

## 2012-11-04 MED ORDER — ALPRAZOLAM 0.25 MG PO TABS: 0.2500 mg | ORAL_TABLET | Freq: Two times a day (BID) | ORAL | Status: AC | PRN

## 2012-11-04 NOTE — Telephone Encounter (Signed)
CALLED IN PT RX FOR XANAX TO HER PHARMACY.

## 2013-01-06 ENCOUNTER — Other Ambulatory Visit: Payer: Self-pay | Admitting: Obstetrics and Gynecology

## 2013-01-06 ENCOUNTER — Encounter: Payer: Self-pay | Admitting: Obstetrics and Gynecology

## 2013-01-06 MED ORDER — AZITHROMYCIN 250 MG PO TABS: ORAL_TABLET | ORAL | Status: AC

## 2013-01-06 MED ORDER — FLUCONAZOLE 150 MG PO TABS: 150.0000 mg | ORAL_TABLET | Freq: Once | ORAL | Status: AC

## 2013-01-06 MED ORDER — GUAIFENESIN-CODEINE 100-10 MG/5ML PO SOLN: 5.0000 mL | Freq: Three times a day (TID) | ORAL | Status: AC | PRN

## 2013-01-06 NOTE — Progress Notes (Signed)
47 YO with bronchitis symptoms for 7 days presents with productive cough (yellow-green) sputum, post-tussive vomiting, bilateral ear pain and sinus/nasal congestion.  Has been taking Halls Cough Drops and OTC sinus medication with no relief.  Denies fever, shortness of breath or syncope but has had chest soreness, laryngitis, occasional wheezes and lightheadedness. Had asthma as a child but no problems as an adult.  Temp = 99 degrees F orally  BP 122/70   ENT: wnl except for TMs being retracted and pharynx mildly erythema without exudate Heart: RRR  Lungs: coarse breath sounds without wheezes, rales or rhonchi  A: Bronchitis  P: Z-pak  UAD      Robitussin AC 120 ml 5 ml tid prn-cough      F/U as needed  Raizel Wesolowski, PA-C

## 2013-01-14 ENCOUNTER — Other Ambulatory Visit: Payer: Self-pay

## 2013-01-14 ENCOUNTER — Other Ambulatory Visit: Payer: Managed Care, Other (non HMO)

## 2013-01-14 DIAGNOSIS — E039 Hypothyroidism, unspecified: Secondary | ICD-10-CM

## 2013-01-14 LAB — T4, FREE: Free T4: 1.68 ng/dL (ref 0.80–1.80)

## 2014-08-24 ENCOUNTER — Other Ambulatory Visit: Payer: Self-pay | Admitting: Obstetrics and Gynecology

## 2014-08-24 DIAGNOSIS — N644 Mastodynia: Secondary | ICD-10-CM

## 2014-08-24 DIAGNOSIS — N6459 Other signs and symptoms in breast: Secondary | ICD-10-CM

## 2014-08-31 ENCOUNTER — Encounter (INDEPENDENT_AMBULATORY_CARE_PROVIDER_SITE_OTHER): Payer: Self-pay

## 2014-08-31 ENCOUNTER — Ambulatory Visit
Admission: RE | Admit: 2014-08-31 | Discharge: 2014-08-31 | Disposition: A | Discharge status: Home / Self Care | Payer: BC Managed Care – PPO | Source: Ambulatory Visit | Attending: Obstetrics and Gynecology | Admitting: Obstetrics and Gynecology

## 2014-08-31 DIAGNOSIS — N644 Mastodynia: Secondary | ICD-10-CM

## 2014-08-31 DIAGNOSIS — N6459 Other signs and symptoms in breast: Secondary | ICD-10-CM

## 2014-09-02 ENCOUNTER — Other Ambulatory Visit: Payer: Managed Care, Other (non HMO)

## 2017-12-26 ENCOUNTER — Other Ambulatory Visit: Payer: Self-pay | Admitting: Obstetrics and Gynecology

## 2017-12-26 ENCOUNTER — Ambulatory Visit
Admission: RE | Admit: 2017-12-26 | Discharge: 2017-12-26 | Disposition: A | Discharge status: Home / Self Care | Payer: Managed Care, Other (non HMO) | Source: Ambulatory Visit | Attending: Obstetrics and Gynecology | Admitting: Obstetrics and Gynecology

## 2017-12-26 DIAGNOSIS — R059 Cough, unspecified: Secondary | ICD-10-CM

## 2017-12-26 DIAGNOSIS — R05 Cough: Secondary | ICD-10-CM

## 2019-03-06 ENCOUNTER — Other Ambulatory Visit: Payer: Self-pay | Admitting: Obstetrics and Gynecology

## 2019-03-06 ENCOUNTER — Ambulatory Visit
Admission: RE | Admit: 2019-03-06 | Discharge: 2019-03-06 | Disposition: A | Discharge status: Home / Self Care | Payer: BLUE CROSS/BLUE SHIELD | Source: Ambulatory Visit | Attending: Obstetrics and Gynecology | Admitting: Obstetrics and Gynecology

## 2019-03-06 ENCOUNTER — Other Ambulatory Visit: Payer: Self-pay

## 2019-03-06 DIAGNOSIS — R109 Unspecified abdominal pain: Secondary | ICD-10-CM

## 2019-03-06 DIAGNOSIS — R0600 Dyspnea, unspecified: Secondary | ICD-10-CM

## 2019-03-12 ENCOUNTER — Ambulatory Visit
Admission: RE | Admit: 2019-03-12 | Discharge: 2019-03-12 | Disposition: A | Discharge status: Home / Self Care | Payer: BLUE CROSS/BLUE SHIELD | Source: Ambulatory Visit | Attending: Obstetrics and Gynecology | Admitting: Obstetrics and Gynecology

## 2019-03-12 ENCOUNTER — Other Ambulatory Visit: Payer: Self-pay

## 2019-03-12 DIAGNOSIS — R109 Unspecified abdominal pain: Secondary | ICD-10-CM

## 2019-05-25 ENCOUNTER — Telehealth: Payer: Self-pay | Admitting: Internal Medicine

## 2019-05-25 DIAGNOSIS — Z20822 Contact with and (suspected) exposure to covid-19: Secondary | ICD-10-CM

## 2019-05-25 NOTE — Telephone Encounter (Signed)
Dorian Pod calling from [facility] Dr [address]. [phone]- fax  Calling for an order to be placed for COVID testing.  Patient symptoms-Shortness of breath, diarhea, body aches & pains,   CB of Patient- [phone]

## 2019-05-26 NOTE — Telephone Encounter (Signed)
Contacted pt, due to [place] being full today pt has been scheduled for tomorrow at the [facility] site for 10:00. Pt is aware to remain in her call and wear a mask. Pt understood and had no additional questions at this time. Nothing further is needed.  Order was placed

## 2019-05-27 ENCOUNTER — Other Ambulatory Visit: Payer: BLUE CROSS/BLUE SHIELD

## 2019-05-27 DIAGNOSIS — Z20822 Contact with and (suspected) exposure to covid-19: Secondary | ICD-10-CM

## 2019-05-30 LAB — NOVEL CORONAVIRUS, NAA: SARS-CoV-2, NAA: NOT DETECTED

## 2019-09-17 ENCOUNTER — Other Ambulatory Visit: Payer: Self-pay

## 2019-09-17 DIAGNOSIS — Z20822 Contact with and (suspected) exposure to covid-19: Secondary | ICD-10-CM

## 2019-09-19 LAB — NOVEL CORONAVIRUS, NAA: SARS-CoV-2, NAA: NOT DETECTED

## 2019-10-16 ENCOUNTER — Other Ambulatory Visit: Payer: Self-pay

## 2019-10-16 DIAGNOSIS — Z20822 Contact with and (suspected) exposure to covid-19: Secondary | ICD-10-CM

## 2019-10-18 LAB — NOVEL CORONAVIRUS, NAA: SARS-CoV-2, NAA: NOT DETECTED

## 2020-01-25 ENCOUNTER — Other Ambulatory Visit: Payer: Self-pay | Admitting: Obstetrics and Gynecology

## 2020-01-25 DIAGNOSIS — N644 Mastodynia: Secondary | ICD-10-CM

## 2020-01-26 ENCOUNTER — Ambulatory Visit
Admission: RE | Admit: 2020-01-26 | Discharge: 2020-01-26 | Disposition: A | Discharge status: Home / Self Care | Payer: BC Managed Care – PPO | Source: Ambulatory Visit | Attending: Obstetrics and Gynecology | Admitting: Obstetrics and Gynecology

## 2020-01-26 ENCOUNTER — Ambulatory Visit: Payer: BC Managed Care – PPO

## 2020-01-26 DIAGNOSIS — N644 Mastodynia: Secondary | ICD-10-CM

## 2020-02-08 ENCOUNTER — Ambulatory Visit: Payer: BC Managed Care – PPO | Attending: Internal Medicine

## 2020-02-08 DIAGNOSIS — Z23 Encounter for immunization: Secondary | ICD-10-CM

## 2020-02-08 NOTE — Progress Notes (Signed)
   Covid-19 Vaccination Clinic  Name:  Janice Kennedy    MRN: [id number] DOB: 1966-04-21  02/08/2020  [name] was observed post Covid-19 immunization for 15 minutes without incident. She was provided with Vaccine Information Sheet and instruction to access the V-Safe system.   [name] was instructed to call 911 with any severe reactions post vaccine: Marland Kitchen Difficulty breathing  . Swelling of face and throat  . A fast heartbeat  . A bad rash all over body  . Dizziness and weakness   Immunizations Administered    Name Date Dose VIS Date Route   Pfizer COVID-19 Vaccine 02/08/2020 11:48 AM 0.3 mL 10/23/2019 Intramuscular   Manufacturer: [employer], [employer]   Lot: [id number]   NDC: [address]-9

## 2020-12-16 IMAGING — MG DIGITAL DIAGNOSTIC BILAT W/ TOMO W/ CAD
6 of 12 series · 6 of 36 positions shown · non-contrast
Comparison: Previous exam(s).

CLINICAL DATA: 53-year-old female presenting with diffuse bilateral
fullness and tenderness for several months.

EXAM:
DIGITAL DIAGNOSTIC BILATERAL MAMMOGRAM WITH CAD AND TOMO

[R CC synth-2D (1 of 2)]
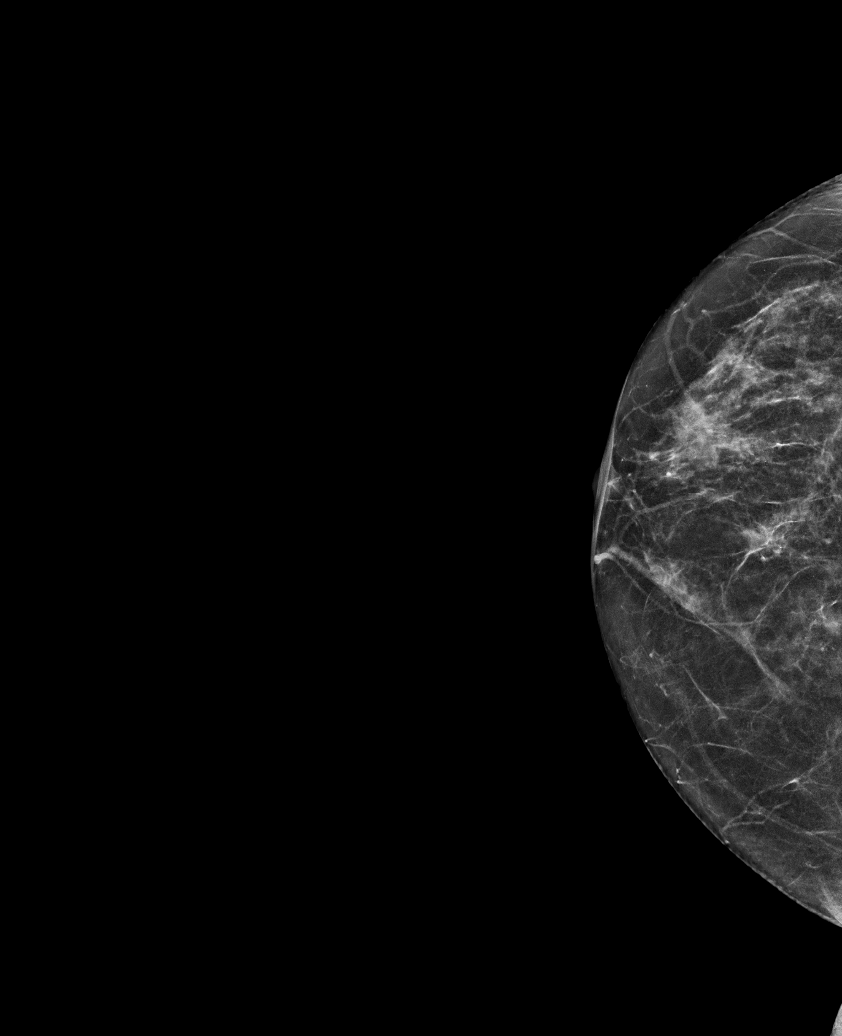

[R MLO synth-2D]
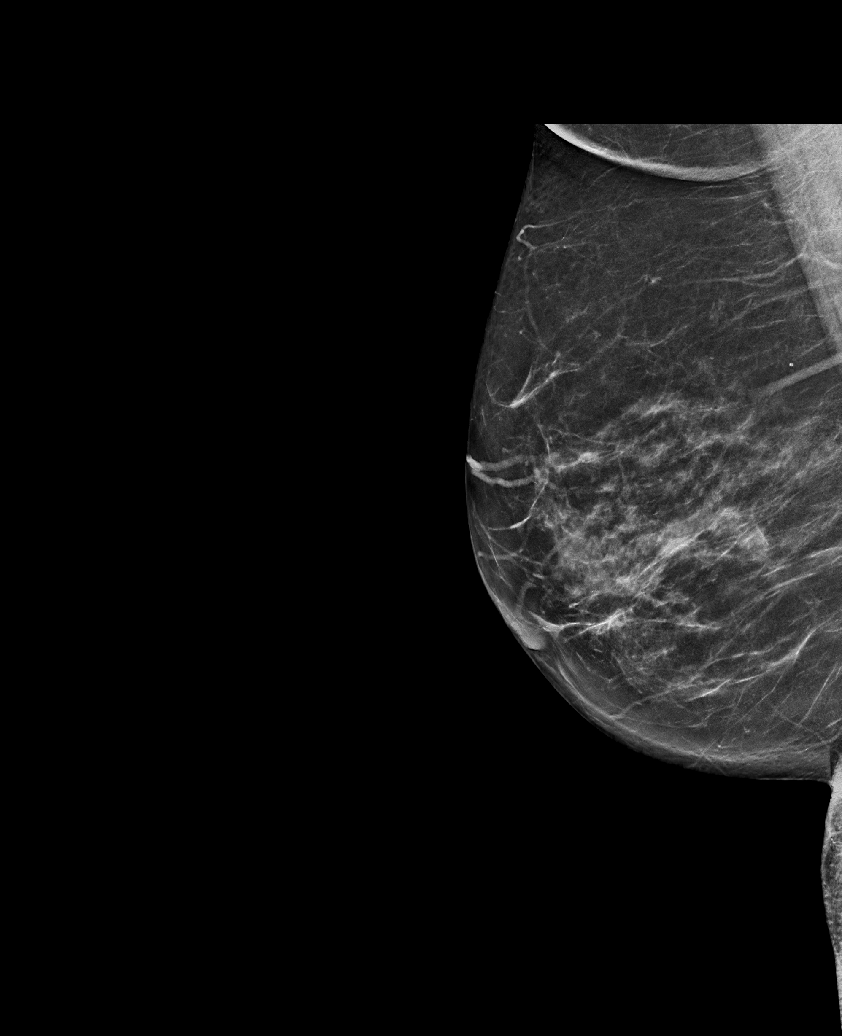

[L CC synth-2D (1 of 2)]
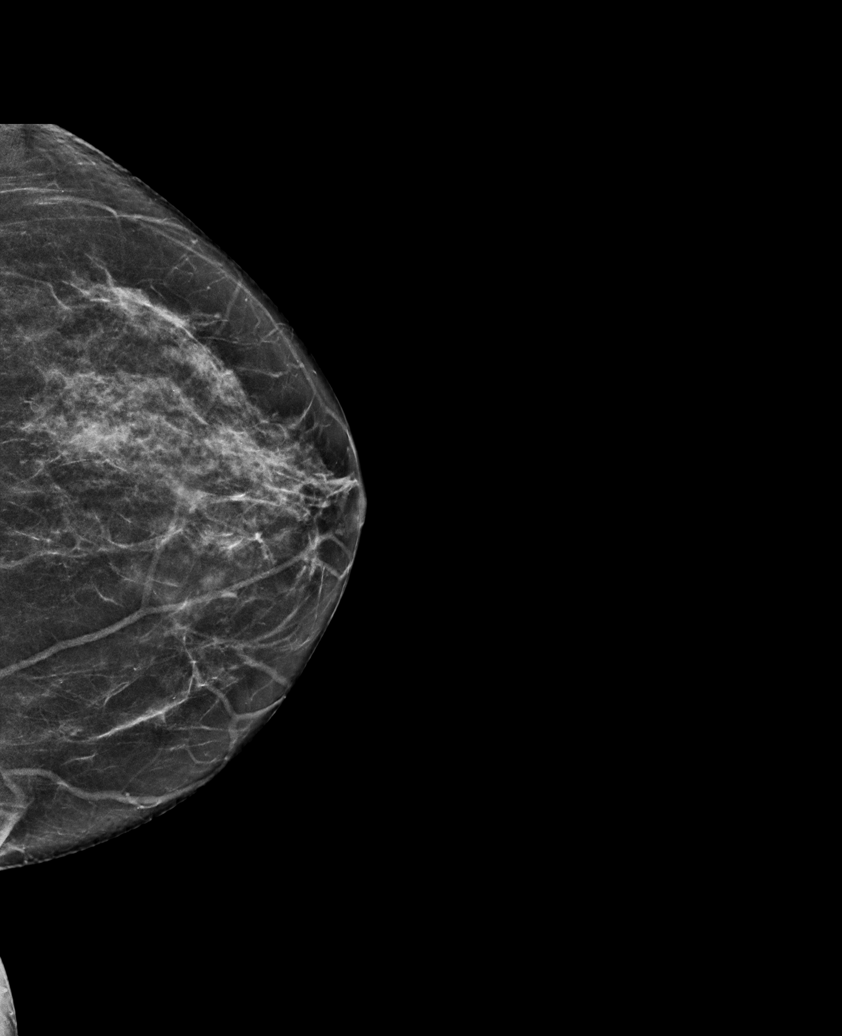

[R CC synth-2D (2 of 2)]
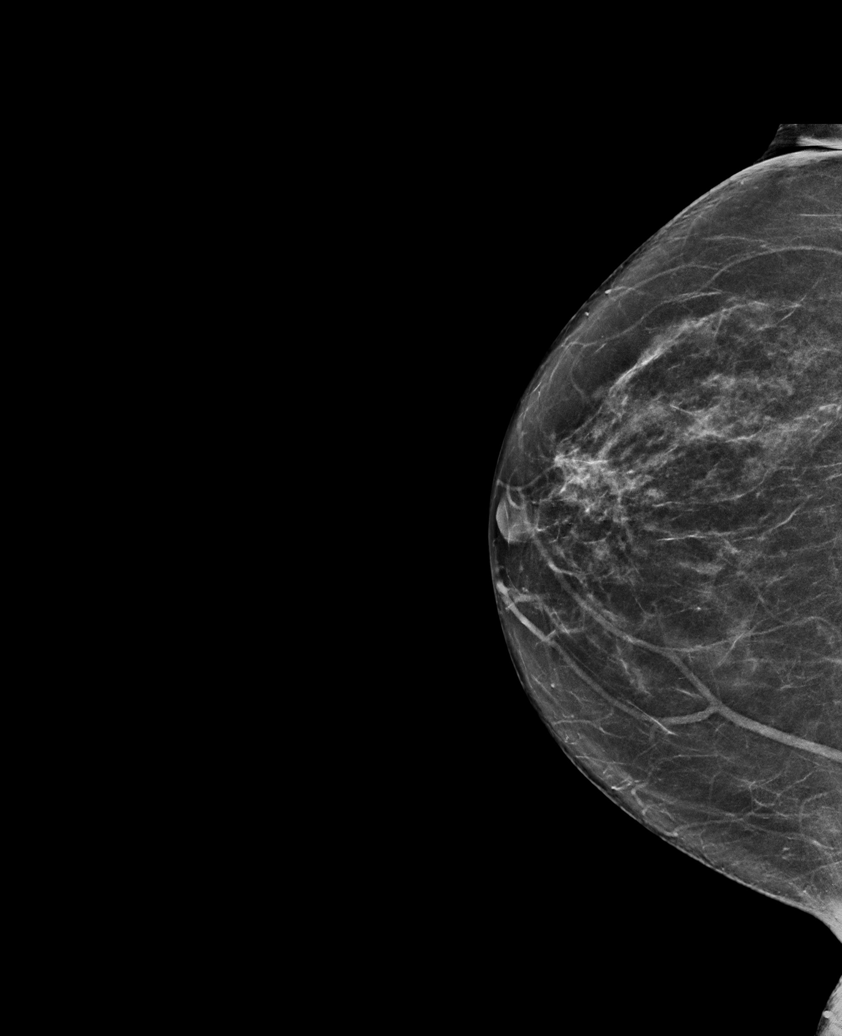

[L MLO synth-2D]
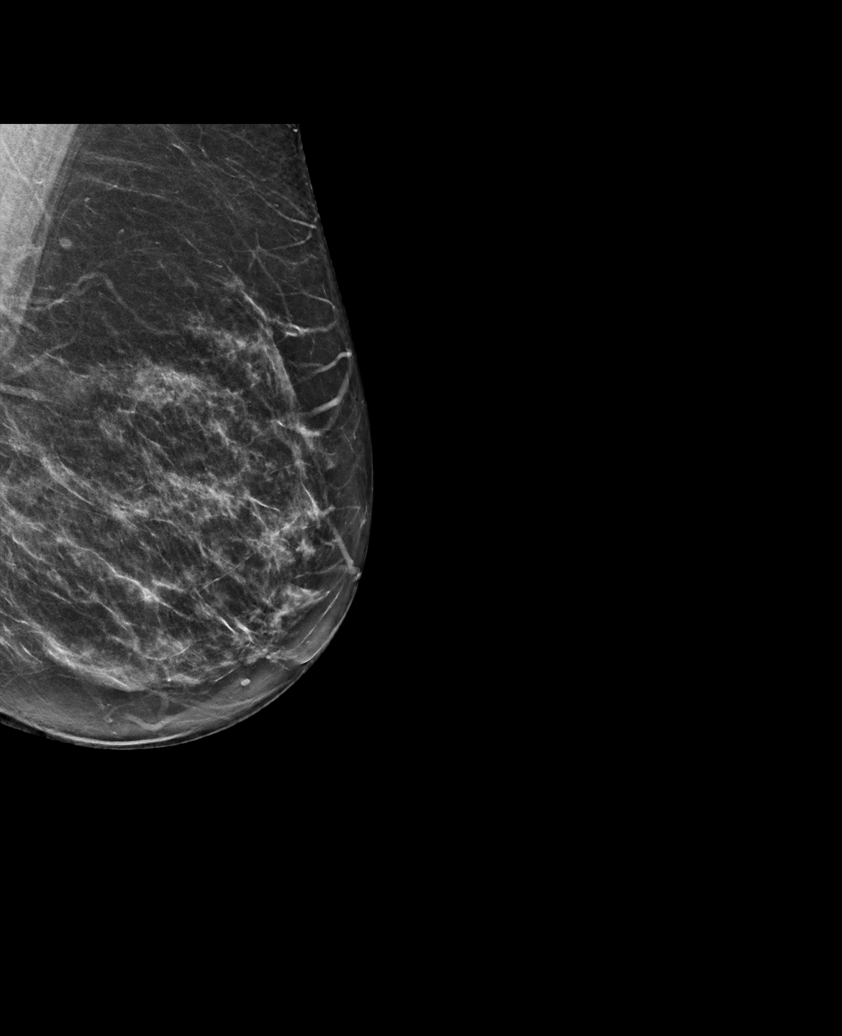

[L CC synth-2D (2 of 2)]
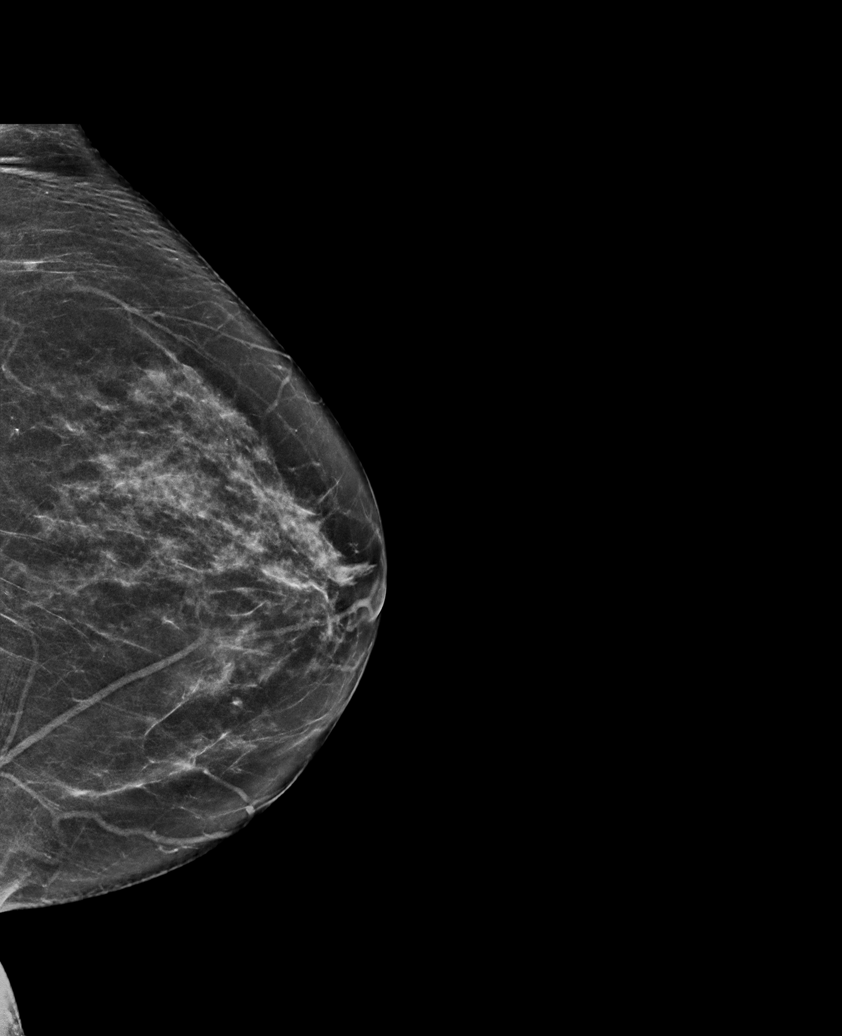

[6 of 36 positions shown; findings below may reference images not displayed]

ACR Breast Density Category c: The breast tissue is heterogeneously
dense, which may obscure small masses.
FINDINGS: Right breast: No suspicious mass, distortion, or microcalcifications
are identified to suggest presence of malignancy.

Left breast: No suspicious mass, distortion, or microcalcifications
are identified to suggest presence of malignancy.

Mammographic images were processed with CAD.
IMPRESSION: No mammographic evidence of malignancy in the bilateral breasts or
other finding to explain the patient's bilateral fullness and
tenderness. The patient did start hormone therapy this past year.

RECOMMENDATION:
1. Clinical follow-up as needed for the bilateral breast tenderness.
Patient stated her hormone therapy dose was recently decreased and
she will be starting vitamin-E.

2.  Screening mammogram in one year.(Code:[id number])

I have discussed the findings and recommendations with the patient.
If applicable, a reminder letter will be sent to the patient
regarding the next appointment.

BI-RADS CATEGORY  1: Negative.

## 2022-01-24 ENCOUNTER — Other Ambulatory Visit (HOSPITAL_BASED_OUTPATIENT_CLINIC_OR_DEPARTMENT_OTHER): Payer: Self-pay

## 2022-01-24 ENCOUNTER — Encounter (HOSPITAL_BASED_OUTPATIENT_CLINIC_OR_DEPARTMENT_OTHER): Payer: Self-pay

## 2022-01-24 MED ORDER — WEGOVY 0.5 MG/0.5ML ~~LOC~~ SOAJ
SUBCUTANEOUS | 2 refills | Status: AC
  Filled 2022-01-24 – 2022-01-26 (×2): qty 2, 28d supply, fill #0

## 2022-01-26 ENCOUNTER — Other Ambulatory Visit (HOSPITAL_BASED_OUTPATIENT_CLINIC_OR_DEPARTMENT_OTHER): Payer: Self-pay

## 2022-02-09 ENCOUNTER — Other Ambulatory Visit (HOSPITAL_BASED_OUTPATIENT_CLINIC_OR_DEPARTMENT_OTHER): Payer: Self-pay

## 2022-02-09 MED ORDER — AZITHROMYCIN 250 MG PO TABS
ORAL_TABLET | ORAL | 3 refills | Status: AC
  Filled 2022-02-09: qty 6, 5d supply, fill #0

## 2022-02-14 ENCOUNTER — Other Ambulatory Visit (HOSPITAL_BASED_OUTPATIENT_CLINIC_OR_DEPARTMENT_OTHER): Payer: Self-pay

## 2022-02-14 MED ORDER — WEGOVY 1 MG/0.5ML ~~LOC~~ SOAJ
SUBCUTANEOUS | 0 refills | Status: AC
  Filled 2022-02-14 – 2022-02-21 (×5): qty 2, 28d supply, fill #0

## 2022-02-15 ENCOUNTER — Other Ambulatory Visit (HOSPITAL_BASED_OUTPATIENT_CLINIC_OR_DEPARTMENT_OTHER): Payer: Self-pay

## 2022-02-19 ENCOUNTER — Other Ambulatory Visit (HOSPITAL_BASED_OUTPATIENT_CLINIC_OR_DEPARTMENT_OTHER): Payer: Self-pay

## 2022-02-19 ENCOUNTER — Encounter (HOSPITAL_BASED_OUTPATIENT_CLINIC_OR_DEPARTMENT_OTHER): Payer: Self-pay

## 2022-02-20 ENCOUNTER — Other Ambulatory Visit (HOSPITAL_BASED_OUTPATIENT_CLINIC_OR_DEPARTMENT_OTHER): Payer: Self-pay

## 2022-02-21 ENCOUNTER — Other Ambulatory Visit (HOSPITAL_BASED_OUTPATIENT_CLINIC_OR_DEPARTMENT_OTHER): Payer: Self-pay

## 2022-03-01 ENCOUNTER — Other Ambulatory Visit (HOSPITAL_BASED_OUTPATIENT_CLINIC_OR_DEPARTMENT_OTHER): Payer: Self-pay

## 2022-03-01 MED ORDER — VALACYCLOVIR HCL 1 G PO TABS
ORAL_TABLET | ORAL | 3 refills | Status: AC
  Filled 2022-03-01: qty 6, 3d supply, fill #0

## 2022-03-14 ENCOUNTER — Other Ambulatory Visit (HOSPITAL_BASED_OUTPATIENT_CLINIC_OR_DEPARTMENT_OTHER): Payer: Self-pay

## 2022-03-14 MED ORDER — ONDANSETRON 8 MG PO TBDP
ORAL_TABLET | ORAL | 3 refills | Status: DC
  Filled 2022-03-14: qty 30, 15d supply, fill #0

## 2022-03-26 ENCOUNTER — Other Ambulatory Visit (HOSPITAL_BASED_OUTPATIENT_CLINIC_OR_DEPARTMENT_OTHER): Payer: Self-pay

## 2022-03-26 MED ORDER — WEGOVY 1.7 MG/0.75ML ~~LOC~~ SOAJ
SUBCUTANEOUS | 0 refills | Status: AC
  Filled 2022-03-26: qty 3, 28d supply, fill #0

## 2022-04-24 ENCOUNTER — Other Ambulatory Visit (HOSPITAL_BASED_OUTPATIENT_CLINIC_OR_DEPARTMENT_OTHER): Payer: Self-pay

## 2022-04-24 MED ORDER — WEGOVY 2.4 MG/0.75ML ~~LOC~~ SOAJ
SUBCUTANEOUS | 1 refills | Status: AC
  Filled 2022-04-24: qty 3, 28d supply, fill #0

## 2022-06-11 ENCOUNTER — Other Ambulatory Visit (HOSPITAL_BASED_OUTPATIENT_CLINIC_OR_DEPARTMENT_OTHER): Payer: Self-pay

## 2022-06-11 MED ORDER — WEGOVY 1 MG/0.5ML ~~LOC~~ SOAJ
SUBCUTANEOUS | 0 refills | Status: DC
  Filled 2022-06-11: qty 2, 28d supply, fill #0

## 2022-06-12 ENCOUNTER — Other Ambulatory Visit (HOSPITAL_BASED_OUTPATIENT_CLINIC_OR_DEPARTMENT_OTHER): Payer: Self-pay

## 2022-06-13 ENCOUNTER — Other Ambulatory Visit (HOSPITAL_BASED_OUTPATIENT_CLINIC_OR_DEPARTMENT_OTHER): Payer: Self-pay

## 2022-06-14 ENCOUNTER — Other Ambulatory Visit (HOSPITAL_BASED_OUTPATIENT_CLINIC_OR_DEPARTMENT_OTHER): Payer: Self-pay

## 2022-07-19 ENCOUNTER — Other Ambulatory Visit (HOSPITAL_BASED_OUTPATIENT_CLINIC_OR_DEPARTMENT_OTHER): Payer: Self-pay

## 2022-07-19 MED ORDER — WEGOVY 1 MG/0.5ML ~~LOC~~ SOAJ
1.0000 mg | SUBCUTANEOUS | 1 refills | Status: AC
  Filled 2022-07-19: qty 2, 28d supply, fill #0
  Filled 2022-09-12 – 2022-09-19 (×2): qty 2, 28d supply, fill #1

## 2022-08-20 ENCOUNTER — Other Ambulatory Visit: Payer: Self-pay | Admitting: Obstetrics and Gynecology

## 2022-08-20 DIAGNOSIS — R928 Other abnormal and inconclusive findings on diagnostic imaging of breast: Secondary | ICD-10-CM

## 2022-09-08 ENCOUNTER — Ambulatory Visit
Admission: RE | Admit: 2022-09-08 | Discharge: 2022-09-08 | Disposition: A | Discharge status: Home / Self Care | Payer: BC Managed Care – PPO | Source: Ambulatory Visit | Attending: Obstetrics and Gynecology | Admitting: Obstetrics and Gynecology

## 2022-09-08 ENCOUNTER — Ambulatory Visit
Admission: RE | Admit: 2022-09-08 | Discharge: 2022-09-08 | Disposition: A | Discharge status: Home / Self Care | Payer: 59 | Source: Ambulatory Visit | Attending: Obstetrics and Gynecology | Admitting: Obstetrics and Gynecology

## 2022-09-08 DIAGNOSIS — R928 Other abnormal and inconclusive findings on diagnostic imaging of breast: Secondary | ICD-10-CM

## 2022-09-12 ENCOUNTER — Other Ambulatory Visit (HOSPITAL_BASED_OUTPATIENT_CLINIC_OR_DEPARTMENT_OTHER): Payer: Self-pay

## 2022-09-19 ENCOUNTER — Other Ambulatory Visit (HOSPITAL_BASED_OUTPATIENT_CLINIC_OR_DEPARTMENT_OTHER): Payer: Self-pay

## 2022-10-12 ENCOUNTER — Other Ambulatory Visit: Payer: BC Managed Care – PPO

## 2023-06-06 ENCOUNTER — Other Ambulatory Visit: Payer: Self-pay | Admitting: Obstetrics and Gynecology

## 2023-06-06 DIAGNOSIS — N6321 Unspecified lump in the left breast, upper outer quadrant: Secondary | ICD-10-CM

## 2023-06-19 ENCOUNTER — Ambulatory Visit
Admission: RE | Admit: 2023-06-19 | Discharge: 2023-06-19 | Disposition: A | Discharge status: Home / Self Care | Payer: 59 | Source: Ambulatory Visit | Attending: Obstetrics and Gynecology | Admitting: Obstetrics and Gynecology

## 2023-06-19 ENCOUNTER — Ambulatory Visit: Admission: RE | Admit: 2023-06-19 | Payer: 59 | Source: Ambulatory Visit

## 2023-06-19 DIAGNOSIS — N6321 Unspecified lump in the left breast, upper outer quadrant: Secondary | ICD-10-CM

## 2023-06-25 ENCOUNTER — Other Ambulatory Visit: Payer: Self-pay

## 2023-12-06 ENCOUNTER — Other Ambulatory Visit: Payer: Self-pay | Admitting: Obstetrics and Gynecology

## 2023-12-06 DIAGNOSIS — R109 Unspecified abdominal pain: Secondary | ICD-10-CM

## 2023-12-13 ENCOUNTER — Other Ambulatory Visit (HOSPITAL_COMMUNITY): Payer: Self-pay | Admitting: Obstetrics and Gynecology

## 2023-12-13 DIAGNOSIS — R002 Palpitations: Secondary | ICD-10-CM

## 2023-12-17 ENCOUNTER — Encounter (HOSPITAL_COMMUNITY): Payer: Self-pay | Admitting: Obstetrics and Gynecology

## 2023-12-31 ENCOUNTER — Emergency Department (HOSPITAL_BASED_OUTPATIENT_CLINIC_OR_DEPARTMENT_OTHER): Payer: 59 | Admitting: Radiology

## 2023-12-31 ENCOUNTER — Other Ambulatory Visit: Payer: Self-pay

## 2023-12-31 ENCOUNTER — Telehealth (HOSPITAL_COMMUNITY): Payer: Self-pay | Admitting: Obstetrics and Gynecology

## 2023-12-31 ENCOUNTER — Emergency Department (HOSPITAL_BASED_OUTPATIENT_CLINIC_OR_DEPARTMENT_OTHER)
Admission: EM | Admit: 2023-12-31 | Discharge: 2024-01-01 | Disposition: A | Discharge status: Home / Self Care | Payer: 59 | Attending: Emergency Medicine | Admitting: Emergency Medicine

## 2023-12-31 ENCOUNTER — Encounter (HOSPITAL_BASED_OUTPATIENT_CLINIC_OR_DEPARTMENT_OTHER): Payer: Self-pay | Admitting: Emergency Medicine

## 2023-12-31 DIAGNOSIS — Z79899 Other long term (current) drug therapy: Secondary | ICD-10-CM | POA: Insufficient documentation

## 2023-12-31 DIAGNOSIS — E039 Hypothyroidism, unspecified: Secondary | ICD-10-CM | POA: Insufficient documentation

## 2023-12-31 DIAGNOSIS — R079 Chest pain, unspecified: Secondary | ICD-10-CM

## 2023-12-31 DIAGNOSIS — R0789 Other chest pain: Secondary | ICD-10-CM | POA: Diagnosis present

## 2023-12-31 LAB — CBC
HCT: 37.2 % (ref 36.0–46.0)
Hemoglobin: 12.5 g/dL (ref 12.0–15.0)
MCH: 30.3 pg (ref 26.0–34.0)
MCHC: 33.6 g/dL (ref 30.0–36.0)
MCV: 90.1 fL (ref 80.0–100.0)
Platelets: 341 10*3/uL (ref 150–400)
RBC: 4.13 MIL/uL (ref 3.87–5.11)
RDW: 12.1 % (ref 11.5–15.5)
WBC: 7 10*3/uL (ref 4.0–10.5)
nRBC: 0 % (ref 0.0–0.2)

## 2023-12-31 LAB — BASIC METABOLIC PANEL
Anion gap: 6 (ref 5–15)
BUN: 11 mg/dL (ref 6–20)
CO2: 28 mmol/L (ref 22–32)
Calcium: 9.5 mg/dL (ref 8.9–10.3)
Chloride: 102 mmol/L (ref 98–111)
Creatinine, Ser: 0.92 mg/dL (ref 0.44–1.00)
GFR, Estimated: >60 mL/min (ref >60)
Glucose, Bld: 89 mg/dL (ref 70–99)
Potassium: 4 mmol/L (ref 3.5–5.1)
Sodium: 136 mmol/L (ref 135–145)

## 2023-12-31 LAB — TROPONIN I (HIGH SENSITIVITY)
Troponin I (High Sensitivity): 3 ng/L (ref <18)
Troponin I (High Sensitivity): 4 ng/L (ref <18)

## 2023-12-31 NOTE — ED Provider Notes (Signed)
[place] EMERGENCY DEPARTMENT AT [facility] Provider Note   CSN: [id number] Arrival date & time: 12/31/23  1826     History  Chief Complaint  Patient presents with   Chest Pain    Janice Kennedy is a 58 y.o. female.  Patient is a 58 year old female with past medical history of hypothyroidism.  Patient presenting today for evaluation of chest discomfort.  Since waking up this morning and going to work, she has felt unwell describing tightness in her chest, between her shoulder blades, feeling short of breath, blurry vision, and what she describes as fluttering in her chest.  She denies any fevers or chills.  She denies any recent exertional symptoms.  Patient has no prior cardiac history, but is scheduled for a stress test in March.  She does report increased stress in her life.  The history is provided by the patient.       Home Medications Prior to Admission medications   Medication Sig Start Date End Date Taking? Authorizing Provider  ALPRAZolam (XANAX) 0.25 MG tablet Take 1 tablet (0.25 mg total) by mouth 2 (two) times daily as needed. 11/04/12   Henreitta Leber, PA-C  azithromycin (ZITHROMAX Z-PAK) 250 MG tablet 2 po day 1 then 1 po qd until completed 01/06/13   Henreitta Leber, PA-C  azithromycin (ZITHROMAX Z-PAK) 250 MG tablet TAKE 2 TABLETS (500 MG) BY ORAL ROUTE ONCE DAILY FOR 1 DAY THEN 1 TABLET (250 MG) BY ORAL ROUTE ONCE DAILY FOR 4 DAYS 02/09/22     fluconazole (DIFLUCAN) 150 MG tablet Take 1 tablet (150 mg total) by mouth once. 01/06/13   Henreitta Leber, PA-C  guaiFENesin-codeine 100-10 MG/5ML syrup Take 5 mLs by mouth 3 (three) times daily as needed for cough. 01/06/13   Henreitta Leber, PA-C  levonorgestrel (MIRENA) 20 MCG/24HR IUD 1 each by Intrauterine route as directed.      [provider]  levothyroxine (SYNTHROID, LEVOTHROID) 100 MCG tablet Take 1 tablet (100 mcg total) by mouth daily. 09/17/12   Henreitta Leber, PA-C  Semaglutide-Weight  Management (WEGOVY) 0.5 MG/0.5ML SOAJ Inject 0.5 mg every week by subcutaneous route for 28 days. 01/24/22     Semaglutide-Weight Management (WEGOVY) 1 MG/0.5ML SOAJ Inject 1 mg every week by subcutaneous route for 28 days. 02/14/22     Semaglutide-Weight Management (WEGOVY) 1 MG/0.5ML SOAJ Inject 1 mg into the skin once a week. 07/19/22     Semaglutide-Weight Management (WEGOVY) 1.7 MG/0.75ML SOAJ Inject one subcutaneous pen injector once per week x 4 weeks. 03/26/22     Semaglutide-Weight Management (WEGOVY) 2.4 MG/0.75ML SOAJ Inject 2.4 mg into the skin once weekly. 04/24/22     valACYclovir (VALTREX) 1000 MG tablet Take 1 tablet every 12 hours by oral route as directed for 3 days. 03/01/22     vitamin B-12 (CYANOCOBALAMIN) 1000 MCG tablet Take 1 tablet (1,000 mcg total) by mouth daily. 03/15/11 03/14/12  Plotnikov, Georgina Quint, MD      Allergies    Patient has no known allergies.    Review of Systems   Review of Systems  All other systems reviewed and are negative.   Physical Exam Updated Vital Signs BP (!) 173/85   Pulse 62   Temp 98.1 F (36.7 C)   Resp 16   Ht 5\' 5"  (1.651 m)   Wt 76.7 kg   SpO2 98%   BMI 28.12 kg/m  Physical Exam Vitals and nursing note reviewed.  Constitutional:      General: She is  not in acute distress.    Appearance: She is well-developed. She is not diaphoretic.  HENT:     Head: Normocephalic and atraumatic.  Cardiovascular:     Rate and Rhythm: Normal rate and regular rhythm.     Heart sounds: No murmur heard.    No friction rub. No gallop.  Pulmonary:     Effort: Pulmonary effort is normal. No respiratory distress.     Breath sounds: Normal breath sounds. No wheezing.  Abdominal:     General: Bowel sounds are normal. There is no distension.     Palpations: Abdomen is soft.     Tenderness: There is no abdominal tenderness.  Musculoskeletal:        General: Normal range of motion.     Cervical back: Normal range of motion and neck supple.     Right  lower leg: No tenderness. No edema.     Left lower leg: No tenderness. No edema.  Skin:    General: Skin is warm and dry.  Neurological:     General: No focal deficit present.     Mental Status: She is alert and oriented to person, place, and time.     ED Results / Procedures / Treatments   Labs (all labs ordered are listed, but only abnormal results are displayed) Labs Reviewed  BASIC METABOLIC PANEL  CBC  PREGNANCY, URINE  TROPONIN I (HIGH SENSITIVITY)  TROPONIN I (HIGH SENSITIVITY)    EKG EKG Interpretation Date/Time:  Tuesday December 31 2023 18:37:21 EST Ventricular Rate:  73 PR Interval:  144 QRS Duration:  80 QT Interval:  394 QTC Calculation: 434 R Axis:   0  Text Interpretation: Normal sinus rhythm Normal ECG No previous ECGs available Confirmed by Geoffery Lyons ([id number]) on 12/31/2023 11:02:43 PM  Radiology DG Chest 2 View Result Date: 12/31/2023 CLINICAL DATA:  Chest pain on the left EXAM: CHEST - 2 VIEW COMPARISON:  03/06/2019 FINDINGS: The heart size and mediastinal contours are within normal limits. Both lungs are clear. The visualized skeletal structures are unremarkable. IMPRESSION: No active cardiopulmonary disease. Electronically Signed   By: Alcide Clever M.D.   On: 12/31/2023 19:47    Procedures Procedures    Medications Ordered in ED Medications - No data to display  ED Course/ Medical Decision Making/ A&P  Patient is a 58 year old female presenting with chest discomfort as described in the HPI.  Patient arrives here with stable vital signs and is afebrile.  Physical examination basically unremarkable.  Laboratory studies obtained including CBC, metabolic panel, and troponin x 2, all of which are unremarkable.  Chest x-ray shows no acute process.  Patient observed for multiple hours and is experiencing no additional symptoms.  She is clinically well-appearing and workup is unremarkable.  She has no cardiac risk factors and today's workup is  reassuring.  I feel as though she can safely be discharged.  She does have a stress test scheduled for next month.  Patient will call the cardiology office in the morning and see if it is possible to have this study sooner.  Patient is to return here in the meantime if symptoms worsen or change.  Doubt ACS, pulmonary embolism, or dissection.  Final Clinical Impression(s) / ED Diagnoses Final diagnoses:  None    Rx / DC Orders ED Discharge Orders     None         Geoffery Lyons, MD 12/31/23 2359

## 2023-12-31 NOTE — ED Triage Notes (Signed)
C/o left sided CP that rads into left arm. Endorses SHOB and dizziness. Denies cardiac hx.

## 2023-12-31 NOTE — Telephone Encounter (Signed)
Just an FYI. We have made several attempts to contact this patient including sending a letter to schedule or reschedule their GXT . We will be removing the patient from the echo WQ.    MAILED LETTER  12/17/23 [facility] to schedule @ 8:56/LBW  12/16/23 [facility] to schedule @ 2:24/LBW  12/13/23 [facility] to schedule @ 9:39/LBW      Thank you

## 2024-01-01 NOTE — Discharge Instructions (Signed)
Follow-up with your primary doctor.  Call the cardiology office in the morning and see if it is possible to expedite your stress test.  Return to the emergency department in the meantime if your symptoms significantly worsen or change.

## 2024-01-02 ENCOUNTER — Inpatient Hospital Stay: Admission: RE | Admit: 2024-01-02 | Payer: 59 | Source: Ambulatory Visit

## 2024-01-04 ENCOUNTER — Ambulatory Visit
Admission: RE | Admit: 2024-01-04 | Discharge: 2024-01-04 | Disposition: A | Discharge status: Home / Self Care | Payer: 59 | Source: Ambulatory Visit | Attending: Obstetrics and Gynecology | Admitting: Obstetrics and Gynecology

## 2024-01-04 DIAGNOSIS — R109 Unspecified abdominal pain: Secondary | ICD-10-CM

## 2024-01-04 MED ORDER — IOPAMIDOL (ISOVUE-300) INJECTION 61%
100.0000 mL | Freq: Once | INTRAVENOUS | Status: AC | PRN
  Administered 2024-01-04: 100 mL via INTRAVENOUS

## 2024-01-06 ENCOUNTER — Other Ambulatory Visit (HOSPITAL_COMMUNITY): Payer: Self-pay | Admitting: Obstetrics and Gynecology

## 2024-01-06 DIAGNOSIS — R112 Nausea with vomiting, unspecified: Secondary | ICD-10-CM

## 2024-01-07 ENCOUNTER — Telehealth: Payer: Self-pay

## 2024-01-07 NOTE — Telephone Encounter (Signed)
 Detailed instructions left on the patient's answering machine. Asked to call back with any questions.  S.Alessa Mazur CCT

## 2024-01-14 ENCOUNTER — Ambulatory Visit (HOSPITAL_COMMUNITY): Payer: 59 | Attending: Obstetrics and Gynecology

## 2024-01-14 DIAGNOSIS — R002 Palpitations: Secondary | ICD-10-CM | POA: Insufficient documentation

## 2024-01-14 LAB — EXERCISE TOLERANCE TEST
Angina Index: 0
Duke Treadmill Score: 10
Estimated workload: 12
Exercise duration (min): 10 min
Exercise duration (sec): 12 s
MPHR: 179 {beats}/min
Peak HR: 179 {beats}/min
Percent HR: 108 %
Rest HR: 100 {beats}/min
ST Depression (mm): 0 mm

## 2024-01-24 ENCOUNTER — Encounter (HOSPITAL_COMMUNITY): Payer: 59

## 2024-02-13 ENCOUNTER — Other Ambulatory Visit (HOSPITAL_COMMUNITY): Payer: Self-pay | Admitting: Obstetrics and Gynecology

## 2024-02-13 ENCOUNTER — Encounter (HOSPITAL_COMMUNITY)
Admission: RE | Admit: 2024-02-13 | Discharge: 2024-02-13 | Disposition: A | Discharge status: Home / Self Care | Source: Ambulatory Visit | Attending: Obstetrics and Gynecology | Admitting: Obstetrics and Gynecology

## 2024-02-13 DIAGNOSIS — R112 Nausea with vomiting, unspecified: Secondary | ICD-10-CM

## 2024-02-13 MED ORDER — TECHNETIUM TC 99M MEBROFENIN IV KIT
5.3200 | PACK | Freq: Once | INTRAVENOUS | Status: AC | PRN
  Administered 2024-02-13: 5.32 via INTRAVENOUS

## 2024-02-13 MED ORDER — TECHNETIUM TC 99M MEBROFENIN IV KIT: 5.2300 | PACK | Freq: Once | INTRAVENOUS | Status: DC | PRN

## 2024-05-21 ENCOUNTER — Other Ambulatory Visit: Payer: Self-pay

## 2024-05-21 ENCOUNTER — Emergency Department (HOSPITAL_COMMUNITY)
Admission: EM | Admit: 2024-05-21 | Discharge: 2024-05-22 | Disposition: A | Discharge status: Home / Self Care | Attending: Emergency Medicine | Admitting: Emergency Medicine

## 2024-05-21 ENCOUNTER — Emergency Department (HOSPITAL_COMMUNITY)

## 2024-05-21 ENCOUNTER — Encounter (HOSPITAL_COMMUNITY): Payer: Self-pay | Admitting: Emergency Medicine

## 2024-05-21 DIAGNOSIS — R7989 Other specified abnormal findings of blood chemistry: Secondary | ICD-10-CM | POA: Insufficient documentation

## 2024-05-21 DIAGNOSIS — R1084 Generalized abdominal pain: Secondary | ICD-10-CM | POA: Insufficient documentation

## 2024-05-21 DIAGNOSIS — R079 Chest pain, unspecified: Secondary | ICD-10-CM | POA: Diagnosis present

## 2024-05-21 DIAGNOSIS — E039 Hypothyroidism, unspecified: Secondary | ICD-10-CM | POA: Insufficient documentation

## 2024-05-21 DIAGNOSIS — R109 Unspecified abdominal pain: Secondary | ICD-10-CM

## 2024-05-21 LAB — URINALYSIS, ROUTINE W REFLEX MICROSCOPIC
Bilirubin Urine: NEGATIVE
Glucose, UA: NEGATIVE mg/dL
Hgb urine dipstick: NEGATIVE
Ketones, ur: 20 mg/dL — AB
Leukocytes,Ua: NEGATIVE
Nitrite: NEGATIVE
Protein, ur: NEGATIVE mg/dL
Specific Gravity, Urine: 1.012 (ref 1.005–1.030)
pH: 8 (ref 5.0–8.0)

## 2024-05-21 LAB — CBC
HCT: 38.8 % (ref 36.0–46.0)
Hemoglobin: 12.9 g/dL (ref 12.0–15.0)
MCH: 30 pg (ref 26.0–34.0)
MCHC: 33.2 g/dL (ref 30.0–36.0)
MCV: 90.2 fL (ref 80.0–100.0)
Platelets: 319 K/uL (ref 150–400)
RBC: 4.3 MIL/uL (ref 3.87–5.11)
RDW: 11.9 % (ref 11.5–15.5)
WBC: 7.4 K/uL (ref 4.0–10.5)
nRBC: 0 % (ref 0.0–0.2)

## 2024-05-21 LAB — LIPASE, BLOOD: Lipase: 29 U/L (ref 11–51)

## 2024-05-21 LAB — D-DIMER, QUANTITATIVE: D-Dimer, Quant: <0.27 ug{FEU}/mL (ref 0.00–0.50)

## 2024-05-21 LAB — BASIC METABOLIC PANEL WITH GFR
Anion gap: 9 (ref 5–15)
BUN: 9 mg/dL (ref 6–20)
CO2: 22 mmol/L (ref 22–32)
Calcium: 9.4 mg/dL (ref 8.9–10.3)
Chloride: 105 mmol/L (ref 98–111)
Creatinine, Ser: 1.13 mg/dL — ABNORMAL HIGH (ref 0.44–1.00)
GFR, Estimated: 57 mL/min — ABNORMAL LOW (ref >60)
Glucose, Bld: 119 mg/dL — ABNORMAL HIGH (ref 70–99)
Potassium: 3.8 mmol/L (ref 3.5–5.1)
Sodium: 136 mmol/L (ref 135–145)

## 2024-05-21 LAB — HEPATIC FUNCTION PANEL
ALT: 15 U/L (ref 0–44)
AST: 17 U/L (ref 15–41)
Albumin: 3.9 g/dL (ref 3.5–5.0)
Alkaline Phosphatase: 40 U/L (ref 38–126)
Bilirubin, Direct: 0.2 mg/dL (ref 0.0–0.2)
Indirect Bilirubin: 0.4 mg/dL (ref 0.3–0.9)
Total Bilirubin: 0.6 mg/dL (ref 0.0–1.2)
Total Protein: 6.8 g/dL (ref 6.5–8.1)

## 2024-05-21 LAB — TROPONIN I (HIGH SENSITIVITY)
Troponin I (High Sensitivity): 3 ng/L (ref <18)
Troponin I (High Sensitivity): 5 ng/L (ref <18)

## 2024-05-21 MED ORDER — ONDANSETRON HCL 4 MG PO TABS: 4.0000 mg | ORAL_TABLET | Freq: Three times a day (TID) | ORAL | Status: AC | PRN

## 2024-05-21 MED ORDER — PANTOPRAZOLE SODIUM 40 MG IV SOLR
40.0000 mg | Freq: Once | INTRAVENOUS | Status: AC
  Administered 2024-05-21: 40 mg via INTRAVENOUS
  Filled 2024-05-21: qty 10

## 2024-05-21 MED ORDER — PANTOPRAZOLE SODIUM 20 MG PO TBEC: 20.0000 mg | DELAYED_RELEASE_TABLET | Freq: Every day | ORAL | 0 refills | Status: AC

## 2024-05-21 MED ORDER — FAMOTIDINE IN NACL 20-0.9 MG/50ML-% IV SOLN
20.0000 mg | Freq: Once | INTRAVENOUS | Status: AC
  Administered 2024-05-21: 20 mg via INTRAVENOUS
  Filled 2024-05-21: qty 50

## 2024-05-21 MED ORDER — KETOROLAC TROMETHAMINE 15 MG/ML IJ SOLN: 15.0000 mg | Freq: Once | INTRAMUSCULAR | Status: DC

## 2024-05-21 MED ORDER — FAMOTIDINE 20 MG PO TABS: 20.0000 mg | ORAL_TABLET | Freq: Once | ORAL | Status: DC

## 2024-05-21 MED ORDER — MORPHINE SULFATE (PF) 4 MG/ML IV SOLN
4.0000 mg | Freq: Once | INTRAVENOUS | Status: AC
  Administered 2024-05-21: 4 mg via INTRAVENOUS
  Filled 2024-05-21: qty 1

## 2024-05-21 MED ORDER — LACTATED RINGERS IV BOLUS
1000.0000 mL | Freq: Once | INTRAVENOUS | Status: AC
  Administered 2024-05-21: 1000 mL via INTRAVENOUS

## 2024-05-21 MED ORDER — IOHEXOL 350 MG/ML SOLN
75.0000 mL | Freq: Once | INTRAVENOUS | Status: AC | PRN
Start: 2024-05-21 — End: 2024-05-21
  Administered 2024-05-21: 75 mL via INTRAVENOUS

## 2024-05-21 MED ORDER — MORPHINE SULFATE (PF) 2 MG/ML IV SOLN
2.0000 mg | Freq: Once | INTRAVENOUS | Status: AC
  Administered 2024-05-21: 2 mg via INTRAVENOUS
  Filled 2024-05-21: qty 1

## 2024-05-21 MED ORDER — FAMOTIDINE 20 MG PO TABS: 20.0000 mg | ORAL_TABLET | Freq: Two times a day (BID) | ORAL | 0 refills | Status: AC

## 2024-05-21 MED ORDER — ALUM & MAG HYDROXIDE-SIMETH 200-200-20 MG/5ML PO SUSP
30.0000 mL | Freq: Once | ORAL | Status: AC
  Administered 2024-05-21: 30 mL via ORAL
  Filled 2024-05-21: qty 30

## 2024-05-21 MED ORDER — ONDANSETRON HCL 4 MG/2ML IJ SOLN
4.0000 mg | Freq: Once | INTRAMUSCULAR | Status: AC
  Administered 2024-05-21: 4 mg via INTRAVENOUS
  Filled 2024-05-21: qty 2

## 2024-05-21 MED ORDER — FAMOTIDINE 20 MG PO TABS: 20.0000 mg | ORAL_TABLET | Freq: Two times a day (BID) | ORAL | 0 refills | Status: DC

## 2024-05-21 NOTE — ED Notes (Signed)
 Patient transported to X-ray

## 2024-05-21 NOTE — Discharge Instructions (Signed)
 You were seen today for chest pain and left side abdominal pain that began suddenly after eating [employer] earlier today and accompanied with 1 episode of vomiting.  Your lab work and imaging today were reassuring that have low suspicion for any emergent cause of your symptoms today.  Recommend you continue to follow-up with primary care for reevaluation as I believe likely cause of symptoms today is a stomach inflammation from the food you ingested earlier today.  Recommend he continue to monitor symptoms at home and return for any new or worsening symptoms.  I am sending home with some nausea medication as well as with a proton pump inhibitor and medication for the burning sensation he felt today.  Take the Protonix  daily as this will help establish a baseline of lowering the acidity of your stomach acid, take the famotidine  as needed for when you are experiencing worsening sensation of the burning.  Take the Zofran  as needed for nausea.  Follow-up with PCP for any persistent symptoms, however return to the ED if you cannot have any new or worsening symptoms which include fever, black or red stools with blood noted, shortness of breath

## 2024-05-21 NOTE — ED Notes (Signed)
 Patient transported to CT

## 2024-05-21 NOTE — ED Provider Notes (Signed)
 [place] EMERGENCY DEPARTMENT AT [facility] Provider Note   CSN: [id number] Arrival date & time: 05/21/24  1616     Patient presents with: Chest Pain   Janice Kennedy is a 58 y.o. female.    Chest Pain Associated symptoms: nausea, shortness of breath and vomiting    Patient is a 58 year old female presented today with acute onset chest pain and left side abdominal pain that began after she ate [employer] earlier while at work.  Notes that she has had accompanied symptoms of shortness of breath, left shoulder pain, nausea/vomiting, describing the pain as burning, stabbing.  States that she did have 1 episode of emesis while in the ER lobby reporting that she thinks she may have seen clots of blood in her vomit, however did state that she did have tomatoes and was not sure if it was into blood or digested tomatoes. Previous medical history of hypothyroidism and chronic cholecystitis based off of her HIDA scan and CT.  Denies chronic alcohol use, NSAID use.  Denies fever, headache, vision changes, cough, diarrhea, melena, hematochezia, hematuria, dysuria, lower leg swelling.  Prior to Admission medications   Medication Sig Start Date End Date Taking? Authorizing Provider  famotidine  (PEPCID ) 20 MG tablet Take 1 tablet (20 mg total) by mouth 2 (two) times daily. 05/21/24  Yes Javani Spratt S, PA-C  ondansetron  (ZOFRAN ) 4 MG tablet Take 1 tablet (4 mg total) by mouth every 8 (eight) hours as needed for nausea or vomiting. 05/21/24  Yes Beola Terrall RAMAN, PA-C  pantoprazole  (PROTONIX ) 20 MG tablet Take 1 tablet (20 mg total) by mouth daily. 05/21/24  Yes Lejla Moeser S, PA-C  ALPRAZolam  (XANAX ) 0.25 MG tablet Take 1 tablet (0.25 mg total) by mouth 2 (two) times daily as needed. 11/04/12   Perri Bjork, PA-C  azithromycin  (ZITHROMAX  Z-PAK) 250 MG tablet 2 po day 1 then 1 po qd until completed 01/06/13   Perri Bjork, PA-C  azithromycin  (ZITHROMAX  Z-PAK) 250 MG tablet TAKE  2 TABLETS (500 MG) BY ORAL ROUTE ONCE DAILY FOR 1 DAY THEN 1 TABLET (250 MG) BY ORAL ROUTE ONCE DAILY FOR 4 DAYS 02/09/22     fluconazole  (DIFLUCAN ) 150 MG tablet Take 1 tablet (150 mg total) by mouth once. 01/06/13   Perri Bjork, PA-C  guaiFENesin -codeine  100-10 MG/5ML syrup Take 5 mLs by mouth 3 (three) times daily as needed for cough. 01/06/13   Perri Bjork, PA-C  levonorgestrel (MIRENA) 20 MCG/24HR IUD 1 each by Intrauterine route as directed.      [provider]  levothyroxine  (SYNTHROID , LEVOTHROID) 100 MCG tablet Take 1 tablet (100 mcg total) by mouth daily. 09/17/12   Perri Bjork, PA-C  Semaglutide -Weight Management (WEGOVY ) 0.5 MG/0.5ML SOAJ Inject 0.5 mg every week by subcutaneous route for 28 days. 01/24/22     Semaglutide -Weight Management (WEGOVY ) 1 MG/0.5ML SOAJ Inject 1 mg every week by subcutaneous route for 28 days. 02/14/22     Semaglutide -Weight Management (WEGOVY ) 1 MG/0.5ML SOAJ Inject 1 mg into the skin once a week. 07/19/22     Semaglutide -Weight Management (WEGOVY ) 1.7 MG/0.75ML SOAJ Inject one subcutaneous pen injector once per week x 4 weeks. 03/26/22     Semaglutide -Weight Management (WEGOVY ) 2.4 MG/0.75ML SOAJ Inject 2.4 mg into the skin once weekly. 04/24/22     valACYclovir  (VALTREX ) 1000 MG tablet Take 1 tablet every 12 hours by oral route as directed for 3 days. 03/01/22     vitamin B-12 (CYANOCOBALAMIN) 1000 MCG tablet Take 1 tablet (  1,000 mcg total) by mouth daily. 03/15/11 03/14/12  Plotnikov, Karlynn GAILS, MD    Allergies: Patient has no known allergies.    Review of Systems  Respiratory:  Positive for shortness of breath.   Cardiovascular:  Positive for chest pain.  Gastrointestinal:  Positive for nausea and vomiting.  All other systems reviewed and are negative.   Updated Vital Signs BP (!) 150/93   Pulse 67   Temp (!) 97.5 F (36.4 C)   Resp 20   Ht 5' 5 (1.651 m)   Wt 74.4 kg   SpO2 99%   BMI 27.29 kg/m   Physical Exam Vitals and nursing  note reviewed.  Constitutional:      General: She is not in acute distress.    Appearance: Normal appearance.  HENT:     Head: Normocephalic and atraumatic.     Mouth/Throat:     Mouth: Mucous membranes are moist.     Pharynx: Oropharynx is clear. No oropharyngeal exudate or posterior oropharyngeal erythema.  Eyes:     General: No scleral icterus.       Right eye: No discharge.        Left eye: No discharge.     Extraocular Movements: Extraocular movements intact.     Conjunctiva/sclera: Conjunctivae normal.  Neck:     Vascular: No hepatojugular reflux or JVD.     Trachea: No tracheal deviation.  Cardiovascular:     Rate and Rhythm: Normal rate and regular rhythm.     Pulses: Normal pulses.     Heart sounds: Normal heart sounds. No murmur heard.    No friction rub. No gallop.  Pulmonary:     Effort: Pulmonary effort is normal. No respiratory distress.     Breath sounds: Normal breath sounds. No decreased breath sounds, wheezing, rhonchi or rales.  Chest:     Chest wall: No tenderness.  Abdominal:     General: Abdomen is flat.     Palpations: Abdomen is soft. There is no hepatomegaly or splenomegaly.     Tenderness: There is abdominal tenderness (Generalized left-sided tenderness noted to the patient with no localization). There is no right CVA tenderness, left CVA tenderness or guarding.  Musculoskeletal:     Right lower leg: No tenderness. No edema.     Left lower leg: No tenderness. No edema.  Lymphadenopathy:     Cervical: No cervical adenopathy.  Skin:    General: Skin is warm and dry.     Findings: No ecchymosis, erythema or rash.  Neurological:     General: No focal deficit present.     Mental Status: She is alert and oriented to person, place, and time. Mental status is at baseline.     Motor: No weakness.     Coordination: Coordination normal.  Psychiatric:        Mood and Affect: Mood normal. Mood is not anxious.     (all labs ordered are listed, but only  abnormal results are displayed) Labs Reviewed  BASIC METABOLIC PANEL WITH GFR - Abnormal; Notable for the following components:      Result Value   Glucose, Bld 119 (*)    Creatinine, Ser 1.13 (*)    GFR, Estimated 57 (*)    All other components within normal limits  URINALYSIS, ROUTINE W REFLEX MICROSCOPIC - Abnormal; Notable for the following components:   Color, Urine STRAW (*)    Ketones, ur 20 (*)    All other components within normal limits  CBC  LIPASE, BLOOD  HEPATIC FUNCTION PANEL  D-DIMER, QUANTITATIVE  TROPONIN I (HIGH SENSITIVITY)  TROPONIN I (HIGH SENSITIVITY)    EKG: None  Radiology: CT ABDOMEN PELVIS W CONTRAST Result Date: 05/21/2024 CLINICAL DATA:  Left lower quadrant abdominal pain. EXAM: CT ABDOMEN AND PELVIS WITH CONTRAST TECHNIQUE: Multidetector CT imaging of the abdomen and pelvis was performed using the standard protocol following bolus administration of intravenous contrast. RADIATION DOSE REDUCTION: This exam was performed according to the departmental dose-optimization program which includes automated exposure control, adjustment of the mA and/or kV according to patient size and/or use of iterative reconstruction technique. CONTRAST:  75mL OMNIPAQUE  IOHEXOL  350 MG/ML SOLN COMPARISON:  January 04, 2024. FINDINGS: Lower chest: No acute abnormality. Hepatobiliary: No biliary dilatation is noted. Liver is unremarkable. Continued wall thickening of gallbladder is noted without cholelithiasis. Pancreas: Unremarkable. No pancreatic ductal dilatation or surrounding inflammatory changes. Spleen: Normal in size without focal abnormality. Adrenals/Urinary Tract: Adrenal glands are unremarkable. Kidneys are normal, without renal calculi, focal lesion, or hydronephrosis. Bladder is unremarkable. Stomach/Bowel: Stomach is within normal limits. Appendix appears normal. No evidence of bowel wall thickening, distention, or inflammatory changes. Sigmoid diverticulosis is noted  without inflammation. Vascular/Lymphatic: No significant vascular findings are present. No enlarged abdominal or pelvic lymph nodes. Reproductive: Uterus and bilateral adnexa are unremarkable. Other: No ascites or hernia is noted. Musculoskeletal: No acute or significant osseous findings. IMPRESSION: Mild to moderate gallbladder wall thickening is noted without cholelithiasis. Abdominal ultrasound is recommended for further evaluation. Sigmoid diverticulosis without inflammation. Electronically Signed   By: Lynwood Landy Raddle M.D.   On: 05/21/2024 20:44   DG Chest 2 View Result Date: 05/21/2024 CLINICAL DATA:  Chest pain. EXAM: CHEST - 2 VIEW COMPARISON:  December 31, 2023. FINDINGS: The heart size and mediastinal contours are within normal limits. Both lungs are clear. The visualized skeletal structures are unremarkable. IMPRESSION: No active cardiopulmonary disease. Electronically Signed   By: Lynwood Landy Raddle M.D.   On: 05/21/2024 18:20    Procedures   Medications Ordered in the ED  alum & mag hydroxide-simeth (MAALOX/MYLANTA) 200-200-20 MG/5ML suspension 30 mL (has no administration in time range)  famotidine  (PEPCID ) IVPB 20 mg premix (has no administration in time range)  ondansetron  (ZOFRAN ) injection 4 mg (4 mg Intravenous Given 05/21/24 1745)  morphine  (PF) 2 MG/ML injection 2 mg (2 mg Intravenous Given 05/21/24 1748)  pantoprazole  (PROTONIX ) injection 40 mg (40 mg Intravenous Given 05/21/24 1749)  lactated ringers  bolus 1,000 mL (1,000 mLs Intravenous New Bag/Given 05/21/24 1759)  famotidine  (PEPCID ) IVPB 20 mg premix (0 mg Intravenous Stopped 05/21/24 1944)  morphine  (PF) 4 MG/ML injection 4 mg (4 mg Intravenous Given 05/21/24 1940)  iohexol  (OMNIPAQUE ) 350 MG/ML injection 75 mL (75 mLs Intravenous Contrast Given 05/21/24 2025)                                Medical Decision Making Amount and/or Complexity of Data Reviewed Labs: ordered. Radiology: ordered.  Risk OTC drugs. Prescription  drug management.   This patient is a 58 year old female who presents to the ED for concern of multiple complaints, concerned today due to having acute onset chest pain, shortness of breath, abdominal pain radiating down left aspect of the abdomen that began shortly after eating show pulla earlier today, noting that she did have 1 episode of emesis where she was concerned she may have had blood clots in the vomit however was not sure  because she did eat tomatoes and could not differentiate if they were tomatoes or blood clots.  Describes the chest pain as a burning sensation going from the epigastric region to the chest.  Noted a previous medical history of chronic cholecystitis with HIDA scan done in April.  However with left-sided abdominal pain, epigastric pain lower suspicion for gallbladder etiology as cause of symptoms today.  On physical exam, patient is in no acute distress, afebrile, alert and orient x 4, speaking in full sentences, nontachypneic, nontachycardic.  Notably is tender to the left side of abdomen, no CVA tenderness bilaterally, LCTAB, RRR, no murmur.  No neck stiffness or tenderness.  No lower leg edema.  Unremarkable exam otherwise.  Initial lab work was notable for a mildly elevated creatinine 1.13 and decreased GFR 57 with ketones noted in urine.  Provided fluids as well as Protonix , famotidine , morphine  and Zofran  for nausea.  Provide LR for the dehydration.  Chest x-ray was unremarkable and CT of abdomen did show some mild to moderate gallbladder wall thickening however with no right-sided abdominal pain, lower suspicion that this is the cause of her symptoms today.  Patient states that her symptoms have improved however are still waxing and waning.  Nausea is abated.  Will provide Maalox and have her continue to finish fluids and reevaluate.  Suspect likely cause of gastritis as cause of symptoms today.  Low suspicion for PUD, GUD, upper GI bleed, ACS, PE, pneumonia,  diverticulitis, pancreatitis, or any other emergency as cause of patient's symptoms today.  Will have her follow-up with GI for any persistent symptoms and follow-up with PCP.  Case discussed with attending who agrees with plan.  Patient vital signs have remained stable throughout the course of patient's time in the ED. Low suspicion for any other emergent pathology at this time. I believe this patient is safe to be discharged. Provided strict return to ER precautions. Patient expressed agreement and understanding of plan. All questions were answered.  Differential diagnoses prior to evaluation: The emergent differential diagnosis includes, but is not limited to, biliary colic, cholecystitis, PUD, erosive gastritis, pancreatitis, GERD, esophagitis,. This is not an exhaustive differential.   Past Medical History / Co-morbidities / Social History: Depression, nephrolithiasis, hypothyroidism, anxiety  Additional history: Chart reviewed. Pertinent results include:   Last seen for chest pain in the emergency department on 12/31/2023.  Having no risk factors and was asymptomatic at that time, discharged with patient calling cardiology office and scheduled for a stress test.  Stress test done on 01/14/2024 which noted no ST deviation.  Did note some PVCs during stress but not during recovery and was interpreted as negative for ischemia.  CT abdomen done on 01/04/2024 colonic diverticulosis without findings of acute diverticulitis. Mild symmetric wall thickening of a nondistended gallbladder,  nonspecific but possibly reflecting chronic cholecystitis. Consider  further evaluation by nuclear medicine HIDA scan with calculation of  ejection fraction.   HIDA scan done on 02/13/2024 Which showed marked delayed filling of the gallbladder suggesting chronic cholecystitis.  Lab Tests/Imaging studies: I personally interpreted labs/imaging and the pertinent results include:    CBC unremarkable BMP does show  an elevated creatinine of 1.13 and decreased GFR of 57 but otherwise unremarkable UA shows ketones likely secondary to dehydration Hepatic function panel unremarkable D-dimer negative Lipase unremarkable Troponin and delta troponin negative CT abdomen pelvis shows mild to moderate gallbladder wall thickening and diverticulosis without diverticulitis. Chest x-ray unremarkable. I agree with the radiologist interpretation.  Cardiac monitoring: EKG  obtained and interpreted by myself and attending physician which shows: Normal sinus rhythm   Medications: I ordered medication including famotidine , Protonix , Zofran , LR, morphine ,.  I have reviewed the patients home medicines and have made adjustments as needed.  Critical Interventions: None  Social Determinants of Health: Notably has good follow-up with primary care.  Disposition: After consideration of the diagnostic results and the patients response to treatment, I feel that the patient would benefit from discharge and treatment as above.   emergency department workup does not suggest an emergent condition requiring admission or immediate intervention beyond what has been performed at this time. The plan is: Zofran  for nausea, Protonix  and famotidine  for GERD symptoms, monitor symptoms at home return to the ED for any new or worsening symptoms, follow-up with PCP, call GI to schedule appointment for any persistent symptoms.. The patient is safe for discharge and has been instructed to return immediately for worsening symptoms, change in symptoms or any other concerns.    Final diagnoses:  Chest pain, unspecified type  Left sided abdominal pain    ED Discharge Orders          Ordered    famotidine  (PEPCID ) 20 MG tablet  2 times daily,   Status:  Discontinued        05/21/24 2247    famotidine  (PEPCID ) 20 MG tablet  2 times daily        05/21/24 2247    ondansetron  (ZOFRAN ) 4 MG tablet  Every 8 hours PRN        05/21/24 2247     pantoprazole  (PROTONIX ) 20 MG tablet  Daily        05/21/24 2248               Beola Terrall RAMAN, PA-C 05/21/24 2353    Franklyn Sid SAILOR, MD 05/23/24 [phone]

## 2024-05-21 NOTE — ED Notes (Signed)
 Pt dry heaving at bedside.   Pt provided emesis bag and wash cloth

## 2024-05-21 NOTE — ED Triage Notes (Signed)
 Pt reports chest pain, left shoulder pain, and burning in the chest. Pt reports this started while at work today. Also reports SHOB.

## 2024-06-08 ENCOUNTER — Other Ambulatory Visit: Payer: Self-pay | Admitting: Obstetrics and Gynecology

## 2024-06-08 DIAGNOSIS — R109 Unspecified abdominal pain: Secondary | ICD-10-CM

## 2024-11-25 ENCOUNTER — Other Ambulatory Visit: Payer: Self-pay

## 2024-11-25 ENCOUNTER — Emergency Department (HOSPITAL_BASED_OUTPATIENT_CLINIC_OR_DEPARTMENT_OTHER)
Admission: EM | Admit: 2024-11-25 | Discharge: 2024-11-25 | Disposition: A | Discharge status: Home / Self Care | Attending: Emergency Medicine | Admitting: Emergency Medicine

## 2024-11-25 ENCOUNTER — Encounter (HOSPITAL_BASED_OUTPATIENT_CLINIC_OR_DEPARTMENT_OTHER): Payer: Self-pay | Admitting: Emergency Medicine

## 2024-11-25 ENCOUNTER — Emergency Department (HOSPITAL_BASED_OUTPATIENT_CLINIC_OR_DEPARTMENT_OTHER)

## 2024-11-25 DIAGNOSIS — R519 Headache, unspecified: Secondary | ICD-10-CM | POA: Diagnosis present

## 2024-11-25 DIAGNOSIS — Z79899 Other long term (current) drug therapy: Secondary | ICD-10-CM | POA: Diagnosis not present

## 2024-11-25 DIAGNOSIS — R42 Dizziness and giddiness: Secondary | ICD-10-CM | POA: Diagnosis not present

## 2024-11-25 DIAGNOSIS — E039 Hypothyroidism, unspecified: Secondary | ICD-10-CM | POA: Insufficient documentation

## 2024-11-25 DIAGNOSIS — H538 Other visual disturbances: Secondary | ICD-10-CM | POA: Diagnosis not present

## 2024-11-25 LAB — COMPREHENSIVE METABOLIC PANEL WITH GFR
ALT: 62 U/L — ABNORMAL HIGH (ref 0–44)
AST: 60 U/L — ABNORMAL HIGH (ref 15–41)
Albumin: 4 g/dL (ref 3.5–5.0)
Alkaline Phosphatase: 47 U/L (ref 38–126)
Anion gap: 14 (ref 5–15)
BUN: 13 mg/dL (ref 6–20)
CO2: 21 mmol/L — ABNORMAL LOW (ref 22–32)
Calcium: 9 mg/dL (ref 8.9–10.3)
Chloride: 105 mmol/L (ref 98–111)
Creatinine, Ser: 0.82 mg/dL (ref 0.44–1.00)
GFR, Estimated: >60 mL/min
Glucose, Bld: 92 mg/dL (ref 70–99)
Potassium: 3.7 mmol/L (ref 3.5–5.1)
Sodium: 140 mmol/L (ref 135–145)
Total Bilirubin: <0.2 mg/dL (ref 0.0–1.2)
Total Protein: 5.9 g/dL — ABNORMAL LOW (ref 6.5–8.1)

## 2024-11-25 LAB — CBC WITH DIFFERENTIAL/PLATELET
Abs Immature Granulocytes: 0.01 K/uL (ref 0.00–0.07)
Basophils Absolute: 0 K/uL (ref 0.0–0.1)
Basophils Relative: 1 %
Eosinophils Absolute: 0.3 K/uL (ref 0.0–0.5)
Eosinophils Relative: 5 %
HCT: 40.1 % (ref 36.0–46.0)
Hemoglobin: 13.9 g/dL (ref 12.0–15.0)
Immature Granulocytes: 0 %
Lymphocytes Relative: 34 %
Lymphs Abs: 1.7 K/uL (ref 0.7–4.0)
MCH: 30.9 pg (ref 26.0–34.0)
MCHC: 34.7 g/dL (ref 30.0–36.0)
MCV: 89.1 fL (ref 80.0–100.0)
Monocytes Absolute: 0.5 K/uL (ref 0.1–1.0)
Monocytes Relative: 9 %
Neutro Abs: 2.7 K/uL (ref 1.7–7.7)
Neutrophils Relative %: 51 %
Platelets: 279 K/uL (ref 150–400)
RBC: 4.5 MIL/uL (ref 3.87–5.11)
RDW: 12.4 % (ref 11.5–15.5)
WBC: 5.2 K/uL (ref 4.0–10.5)
nRBC: 0 % (ref 0.0–0.2)

## 2024-11-25 LAB — TSH: TSH: 1.13 u[IU]/mL (ref 0.350–4.500)

## 2024-11-25 MED ORDER — IOHEXOL 350 MG/ML SOLN
75.0000 mL | Freq: Once | INTRAVENOUS | Status: AC | PRN
  Administered 2024-11-25: 100 mL via INTRAVENOUS

## 2024-11-25 NOTE — Discharge Instructions (Addendum)
 You were seen in the emergency department for headache changes in vision temporary changes in speech You did not have any the symptoms tonight The CAT scan of your head and the blood vessels of the head and neck looked okay Your lab work looked okay as well We are not certain what is causing your symptoms but you need to come to the emergency department again if for severe headaches,, changes in vision or weakness on one side of your body We have provided you with a referral to neurology to discuss your symptoms They will be calling you to schedule an appointment to the office

## 2024-11-25 NOTE — ED Triage Notes (Signed)
 Reports lightning bolts in vision starting today around 1000. Blurred vision.  Also had trouble with getting words out.Lasted approx 20 mins. Symptoms have resolved since 10 am  Similar episode on Friday. Slight dizziness Saturday- Tuesday.   During triage, no unilateral deficits. No visual deficits. No speech deficits.

## 2024-11-25 NOTE — ED Notes (Signed)
 Patient transported to CT

## 2024-11-25 NOTE — ED Provider Notes (Signed)
 " [place] [facility] AT [facility] Provider Note   CSN: [id number] Arrival date & time: 11/25/24  1636     Patient presents with: Visual Field Change   Janice Kennedy is a 59 y.o. female.with a history of hypothyroidism and anxiety who presents to the ED for neurologic symptoms. Beginning 5 days ago while at work she began to experience Lightheadedness, blurred vision lasting for several minutes when using computer. Shortly after had difficulty speaking, garbled speech.  She also appreciated sensation of L ear numbness that lasted for several minutes.  She has felt normal over the last 4 days but did have some lightning flashes in her vision along with slight difficulty with speech production and a mild headache today.  Ibuprofen typically relieves her headaches but was ineffective today.  She did have a similar episode of the symptoms about 10 to 11 years ago.  No focal weakness other numbness fevers chills recent illness.  She is asymptomatic at this time.   HPI     Prior to Admission medications  Medication Sig Start Date End Date Taking? Authorizing Provider  ALPRAZolam  (XANAX ) 0.25 MG tablet Take 1 tablet (0.25 mg total) by mouth 2 (two) times daily as needed. 11/04/12   Perri Bjork, PA-C  azithromycin  (ZITHROMAX  Z-PAK) 250 MG tablet 2 po day 1 then 1 po qd until completed 01/06/13   Perri Bjork, PA-C  azithromycin  (ZITHROMAX  Z-PAK) 250 MG tablet TAKE 2 TABLETS (500 MG) BY ORAL ROUTE ONCE DAILY FOR 1 DAY THEN 1 TABLET (250 MG) BY ORAL ROUTE ONCE DAILY FOR 4 DAYS 02/09/22     famotidine  (PEPCID ) 20 MG tablet Take 1 tablet (20 mg total) by mouth 2 (two) times daily. 05/21/24   Bauer, Collin S, PA-C  fluconazole  (DIFLUCAN ) 150 MG tablet Take 1 tablet (150 mg total) by mouth once. 01/06/13   Perri Bjork, PA-C  guaiFENesin -codeine  100-10 MG/5ML syrup Take 5 mLs by mouth 3 (three) times daily as needed for cough. 01/06/13   Perri Bjork, PA-C   levonorgestrel (MIRENA) 20 MCG/24HR IUD 1 each by Intrauterine route as directed.      [provider]  levothyroxine  (SYNTHROID , LEVOTHROID) 100 MCG tablet Take 1 tablet (100 mcg total) by mouth daily. 09/17/12   Perri Bjork, PA-C  ondansetron  (ZOFRAN ) 4 MG tablet Take 1 tablet (4 mg total) by mouth every 8 (eight) hours as needed for nausea or vomiting. 05/21/24   Beola Terrall RAMAN, PA-C  pantoprazole  (PROTONIX ) 20 MG tablet Take 1 tablet (20 mg total) by mouth daily. 05/21/24   Bauer, Collin S, PA-C  Semaglutide -Weight Management (WEGOVY ) 0.5 MG/0.5ML SOAJ Inject 0.5 mg every week by subcutaneous route for 28 days. 01/24/22     Semaglutide -Weight Management (WEGOVY ) 1 MG/0.5ML SOAJ Inject 1 mg every week by subcutaneous route for 28 days. 02/14/22     Semaglutide -Weight Management (WEGOVY ) 1 MG/0.5ML SOAJ Inject 1 mg into the skin once a week. 07/19/22     Semaglutide -Weight Management (WEGOVY ) 1.7 MG/0.75ML SOAJ Inject one subcutaneous pen injector once per week x 4 weeks. 03/26/22     Semaglutide -Weight Management (WEGOVY ) 2.4 MG/0.75ML SOAJ Inject 2.4 mg into the skin once weekly. 04/24/22     valACYclovir  (VALTREX ) 1000 MG tablet Take 1 tablet every 12 hours by oral route as directed for 3 days. 03/01/22     vitamin B-12 (CYANOCOBALAMIN) 1000 MCG tablet Take 1 tablet (1,000 mcg total) by mouth daily. 03/15/11 03/14/12  Plotnikov, Karlynn GAILS, MD  Allergies: Patient has no known allergies.    Review of Systems  Updated Vital Signs BP 132/89   Pulse 84   Temp 98.5 F (36.9 C) (Oral)   Resp 18   SpO2 99%   Physical Exam Vitals and nursing note reviewed.  HENT:     Head: Normocephalic and atraumatic.  Eyes:     Pupils: Pupils are equal, round, and reactive to light.  Cardiovascular:     Rate and Rhythm: Normal rate and regular rhythm.  Pulmonary:     Effort: Pulmonary effort is normal.     Breath sounds: Normal breath sounds.  Abdominal:     Palpations: Abdomen is soft.      Tenderness: There is no abdominal tenderness.  Skin:    General: Skin is warm and dry.  Neurological:     General: No focal deficit present.     Mental Status: She is alert and oriented to person, place, and time.     Sensory: No sensory deficit.     Motor: No weakness.     Coordination: Coordination normal.  Psychiatric:        Mood and Affect: Mood normal.     (all labs ordered are listed, but only abnormal results are displayed) Labs Reviewed  COMPREHENSIVE METABOLIC PANEL WITH GFR - Abnormal; Notable for the following components:      Result Value   CO2 21 (*)    Total Protein 5.9 (*)    AST 60 (*)    ALT 62 (*)    All other components within normal limits  CBC WITH DIFFERENTIAL/PLATELET  TSH    EKG: EKG Interpretation Date/Time:  Wednesday November 25 2024 17:28:59 EST Ventricular Rate:  86 PR Interval:  148 QRS Duration:  91 QT Interval:  377 QTC Calculation: 451 R Axis:   8  Text Interpretation: Sinus rhythm Confirmed by Pamella Sharper [phone]) on 11/25/2024 8:27:26 PM  Radiology: CT ANGIO HEAD NECK W WO CM Result Date: 11/25/2024 EXAM: CTA HEAD AND NECK WITH AND WITHOUT 11/25/2024 06:46:20 PM TECHNIQUE: CTA of the head and neck was performed with and without the administration of 100 mL iohexol  (OMNIPAQUE ) 350 MG/ML injection 75 mL IOHEXOL  350 MG/ML SOLN intravenous contrast. Multiplanar 2D and/or 3D reformatted images are provided for review. Automated exposure control, iterative reconstruction, and/or weight based adjustment of the mA/kV was utilized to reduce the radiation dose to as low as reasonably achievable. Stenosis of the internal carotid arteries measured using NASCET criteria. COMPARISON: None available CLINICAL HISTORY: changes in vision, speech changes FINDINGS: CTA NECK: AORTIC ARCH AND ARCH VESSELS: No dissection or arterial injury. No significant stenosis of the brachiocephalic or subclavian arteries. CERVICAL CAROTID ARTERIES: Right ICA: Mild tortuosity  of the distal right cervical ICA. No hemodynamically significant stenosis. 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 There is no hemodynamically significant stenosis. Additional mild tortuosity of the distal left cervical ICA. Overall: No dissection or arterial injury. No hemodynamically significant stenosis by NASCET criteria. CERVICAL VERTEBRAL ARTERIES: Right Vertebral Artery: Atherosclerosis at the right vertebral artery origin resulting in mild stenosis. Left Vertebral Artery: Minimal atherosclerosis adjacent to the left vertebral artery without stenosis. There is mild tortuosity of the left V1 segment. Overall: No dissection or arterial injury. LUNGS AND MEDIASTINUM: Unremarkable. SOFT TISSUES: No acute abnormality. BONES: Changes in the visualized spine. Disc space narrowing is greatest at C5-C6. CTA HEAD:  ANTERIOR CIRCULATION: No significant stenosis of the internal carotid arteries. No significant stenosis of the anterior cerebral arteries. No significant stenosis of the middle cerebral arteries. No aneurysm. POSTERIOR CIRCULATION: No significant stenosis of the posterior cerebral arteries. No significant stenosis of the basilar artery. No significant stenosis of the vertebral arteries. No aneurysm. OTHER: No dural venous sinus thrombosis on this non-dedicated study. IMPRESSION: 1. No large vessel occlusion, hemodynamically significant stenosis, or aneurysm in the head or neck. 2. 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 3. Atherosclerosis at the right vertebral artery origin resulting in mild stenosis. Electronically signed by: Donnice Mania MD 11/25/2024 07:20 PM EST RP Workstation: [id number]     Procedures   Medications Ordered in the ED  iohexol  (OMNIPAQUE ) 350 MG/ML injection 75 mL (100 mLs  Intravenous Contrast Given 11/25/24 1830)    Clinical Course as of 11/25/24 2100  Wed Nov 25, 2024  2059 CBC metabolic panel unremarkable.  CTA head and neck showed no acute findings.  EKG without evidence of dysrhythmia.  Patient has remained symptom-free.  No need for emergent MRI tonight but will provide her with neurology follow-up for her symptoms.  Most likely cause would be migraine with aura at this time.  Counseled her on return precautions would be worrisome for severe headache or stroke. [MP]    Clinical Course User Index [MP] Pamella Ozell LABOR, DO                                 Medical Decision Making 59 year old female with history as above presenting for waxing and waning neurologic symptoms.  Constellation of symptoms including garbled speech changes in vision left ear numbness started 5 days ago resolved spontaneously but has similar episode with a headache today.  No neurologic deficits on my exam NIH stroke scale score of 0.  Will obtain CTA head and neck to evaluate for stroke here along with laboratory workup.  No infectious symptoms.  Will continue to monitor for any changes here and her neurologic status.  This may be a complex migraine but no need for migraine cocktail or any other medications at this time given that she feels well  Amount and/or Complexity of Data Reviewed Labs: ordered. Radiology: ordered.  Risk Prescription drug management.        Final diagnoses:  Acute nonintractable headache, unspecified headache type    ED Discharge Orders          Ordered    Ambulatory referral to Neurology       Comments: An appointment is requested in approximately: 4 weeks   11/25/24 2059               Pamella Ozell LABOR, DO 11/25/24 2100  "
